# Patient Record
Sex: Male | Born: 1975 | Race: White | Hispanic: No | Marital: Married | Smoking: Former smoker
Health system: Southern US, Community
[De-identification: ages and names within clinical notes are randomized; demographics above are authoritative.]

## PROBLEM LIST (undated history)

## (undated) DIAGNOSIS — Z8052 Family history of malignant neoplasm of bladder: Secondary | ICD-10-CM

## (undated) DIAGNOSIS — M1990 Unspecified osteoarthritis, unspecified site: Secondary | ICD-10-CM

## (undated) DIAGNOSIS — T7840XA Allergy, unspecified, initial encounter: Secondary | ICD-10-CM

## (undated) DIAGNOSIS — K219 Gastro-esophageal reflux disease without esophagitis: Secondary | ICD-10-CM

## (undated) DIAGNOSIS — Z808 Family history of malignant neoplasm of other organs or systems: Secondary | ICD-10-CM

## (undated) DIAGNOSIS — G4730 Sleep apnea, unspecified: Secondary | ICD-10-CM

## (undated) DIAGNOSIS — G709 Myoneural disorder, unspecified: Secondary | ICD-10-CM

## (undated) DIAGNOSIS — R51 Headache: Secondary | ICD-10-CM

## (undated) HISTORY — DX: Allergy, unspecified, initial encounter: T78.40XA

## (undated) HISTORY — DX: Unspecified osteoarthritis, unspecified site: M19.90

## (undated) HISTORY — DX: Family history of malignant neoplasm of other organs or systems: Z80.8

## (undated) HISTORY — PX: ELBOW SURGERY: SHX618

## (undated) HISTORY — PX: CARPAL TUNNEL RELEASE: SHX101

## (undated) HISTORY — DX: Myoneural disorder, unspecified: G70.9

## (undated) HISTORY — PX: FRACTURE SURGERY: SHX138

## (undated) HISTORY — DX: Family history of malignant neoplasm of bladder: Z80.52

## (undated) HISTORY — PX: SPINE SURGERY: SHX786

---

## 2005-02-10 ENCOUNTER — Ambulatory Visit: Payer: Self-pay | Admitting: Family Medicine

## 2005-06-30 ENCOUNTER — Ambulatory Visit: Payer: Self-pay | Admitting: Family Medicine

## 2005-07-09 ENCOUNTER — Ambulatory Visit: Payer: Self-pay | Admitting: Family Medicine

## 2005-09-19 ENCOUNTER — Ambulatory Visit: Payer: Self-pay | Admitting: Family Medicine

## 2008-01-26 ENCOUNTER — Emergency Department (HOSPITAL_COMMUNITY): Admission: EM | Admit: 2008-01-26 | Discharge: 2008-01-26 | Payer: Self-pay | Admitting: Emergency Medicine

## 2009-08-11 HISTORY — PX: HERNIA REPAIR: SHX51

## 2010-06-19 ENCOUNTER — Ambulatory Visit (HOSPITAL_BASED_OUTPATIENT_CLINIC_OR_DEPARTMENT_OTHER): Admission: RE | Admit: 2010-06-19 | Discharge: 2010-06-19 | Payer: Self-pay | Admitting: Family Medicine

## 2010-06-19 ENCOUNTER — Ambulatory Visit: Payer: Self-pay | Admitting: Diagnostic Radiology

## 2010-07-19 ENCOUNTER — Encounter
Admission: RE | Admit: 2010-07-19 | Discharge: 2010-07-19 | Payer: Self-pay | Source: Home / Self Care | Attending: Surgery | Admitting: Surgery

## 2010-07-24 ENCOUNTER — Ambulatory Visit
Admission: RE | Admit: 2010-07-24 | Discharge: 2010-07-24 | Payer: Self-pay | Source: Home / Self Care | Attending: Surgery | Admitting: Surgery

## 2010-10-22 LAB — COMPREHENSIVE METABOLIC PANEL
Albumin: 3.9 g/dL (ref 3.5–5.2)
Alkaline Phosphatase: 57 U/L (ref 39–117)
BUN: 11 mg/dL (ref 6–23)
CO2: 28 mEq/L (ref 19–32)
Chloride: 104 mEq/L (ref 96–112)
GFR calc non Af Amer: >60 mL/min (ref >60)
Glucose, Bld: 99 mg/dL (ref 70–99)
Potassium: 4.1 mEq/L (ref 3.5–5.1)
Total Bilirubin: 0.6 mg/dL (ref 0.3–1.2)

## 2010-10-22 LAB — DIFFERENTIAL
Basophils Absolute: 0 10*3/uL (ref 0.0–0.1)
Basophils Relative: 0 % (ref 0–1)
Monocytes Absolute: 0.5 10*3/uL (ref 0.1–1.0)
Neutro Abs: 4.4 10*3/uL (ref 1.7–7.7)
Neutrophils Relative %: 67 % (ref 43–77)

## 2010-10-22 LAB — CBC
HCT: 41.5 % (ref 39.0–52.0)
Hemoglobin: 14.5 g/dL (ref 13.0–17.0)
MCV: 89.1 fL (ref 78.0–100.0)
RBC: 4.66 MIL/uL (ref 4.22–5.81)
WBC: 6.5 10*3/uL (ref 4.0–10.5)

## 2011-05-08 LAB — ROCKY MTN SPOTTED FVR AB, IGM-BLOOD: RMSF IgM: 0.35 IV

## 2011-05-08 LAB — DIFFERENTIAL
Basophils Absolute: 0
Basophils Relative: 1
Monocytes Relative: 9
Neutro Abs: 4.7
Neutrophils Relative %: 56

## 2011-05-08 LAB — CBC
MCHC: 35.1
RBC: 4.57

## 2011-07-14 ENCOUNTER — Other Ambulatory Visit (HOSPITAL_COMMUNITY): Payer: Self-pay | Admitting: Family Medicine

## 2011-07-14 DIAGNOSIS — R079 Chest pain, unspecified: Secondary | ICD-10-CM

## 2011-07-15 ENCOUNTER — Ambulatory Visit (HOSPITAL_COMMUNITY): Payer: 59 | Attending: Cardiology | Admitting: Radiology

## 2011-07-15 VITALS — Ht 74.0 in | Wt 226.0 lb

## 2011-07-15 DIAGNOSIS — Z87891 Personal history of nicotine dependence: Secondary | ICD-10-CM | POA: Insufficient documentation

## 2011-07-15 DIAGNOSIS — R0989 Other specified symptoms and signs involving the circulatory and respiratory systems: Secondary | ICD-10-CM | POA: Insufficient documentation

## 2011-07-15 DIAGNOSIS — R0602 Shortness of breath: Secondary | ICD-10-CM | POA: Insufficient documentation

## 2011-07-15 DIAGNOSIS — Z8249 Family history of ischemic heart disease and other diseases of the circulatory system: Secondary | ICD-10-CM | POA: Insufficient documentation

## 2011-07-15 DIAGNOSIS — R000 Tachycardia, unspecified: Secondary | ICD-10-CM | POA: Insufficient documentation

## 2011-07-15 DIAGNOSIS — R0609 Other forms of dyspnea: Secondary | ICD-10-CM | POA: Insufficient documentation

## 2011-07-15 DIAGNOSIS — I4510 Unspecified right bundle-branch block: Secondary | ICD-10-CM | POA: Insufficient documentation

## 2011-07-15 DIAGNOSIS — R0789 Other chest pain: Secondary | ICD-10-CM | POA: Insufficient documentation

## 2011-07-15 DIAGNOSIS — I10 Essential (primary) hypertension: Secondary | ICD-10-CM | POA: Insufficient documentation

## 2011-07-15 DIAGNOSIS — R61 Generalized hyperhidrosis: Secondary | ICD-10-CM | POA: Insufficient documentation

## 2011-07-15 DIAGNOSIS — R079 Chest pain, unspecified: Secondary | ICD-10-CM

## 2011-07-15 MED ORDER — TECHNETIUM TC 99M TETROFOSMIN IV KIT
11.0000 | PACK | Freq: Once | INTRAVENOUS | Status: AC | PRN
  Administered 2011-07-15: 11 via INTRAVENOUS

## 2011-07-15 MED ORDER — REGADENOSON 0.4 MG/5ML IV SOLN
0.4000 mg | Freq: Once | INTRAVENOUS | Status: AC
  Administered 2011-07-15: 0.4 mg via INTRAVENOUS

## 2011-07-15 MED ORDER — TECHNETIUM TC 99M TETROFOSMIN IV KIT
33.0000 | PACK | Freq: Once | INTRAVENOUS | Status: AC | PRN
  Administered 2011-07-15: 33 via INTRAVENOUS

## 2011-07-15 NOTE — Progress Notes (Signed)
[facility] SITE 3 NUCLEAR MED [address] [place] [address] [phone]  Cardiology Nuclear Med Study  Douglas [name] is a 35 y.o. male [id number] 09-24-1975   Nuclear Med Background Indication for Stress Test:  Evaluation for Ischemia History:  No previous documented CAD Cardiac Risk Factors: Family History - CAD, History of Smoking, IRBBB and Hypertension  Symptoms:  Chest Pain/Pressure with and without Exertion (last episode of chest discomfort was this morning in the lobby, none now), Diaphoresis, DOE, Rapid HR and SOB   Nuclear Pre-Procedure Caffeine/Decaff Intake:  None NPO After: 9:00pm   Lungs:  Slight expiratory wheeze.  O2 SAT 99% on RA.   Albuterol inhaler used prior to exercise. IV 0.9% NS with Angio Cath:  20g  IV Site: R Wrist  IV Started by:  Douglas [name], EMT-P  Chest Size (in):  52 Cup Size: n/a  Height: 6\' 2"  (1.88 m)  Weight:  226 lb (102.513 kg)  BMI:  Body mass index is 29.02 kg/(m^2). Tech Comments:  Propranolol held > 24 hours, per patient.    Nuclear Med Study 1 or 2 day study: 1 day  Stress Test Type:  Treadmill/Lexiscan  Reading MD: Douglas Ancona, MD  Order Authorizing Provider:  Larita Fife, MD Douglas [name])  Resting Radionuclide: Technetium 26m Tetrofosmin  Resting Radionuclide Dose: 11.0 mCi   Stress Radionuclide:  Technetium 58m Tetrofosmin  Stress Radionuclide Dose: 33.0 mCi           Stress Protocol Rest HR: 51 Stress HR: 133  Rest BP: Sitting:116/72  Standing:110/72 Stress BP: 170/58  Exercise Time (min): 13:30 METS: 13.4   Predicted Max HR: 183 bpm % Max HR: 72.68 bpm Rate Pressure Product: 96295   Dose of Adenosine (mg):  n/a Dose of Lexiscan: 0.4 mg  Dose of Atropine (mg): n/a Dose of Dobutamine: n/a mcg/kg/min (at max HR)  Stress Test Technologist: Douglas [name], CMA-N  Nuclear Technologist:  Douglas [name], CNMT     Rest Procedure:  Myocardial perfusion imaging was performed at rest 45  minutes following the intravenous administration of Technetium 7m Tetrofosmin.  Rest ECG: RBBB  Stress Procedure: The patient initially walked the treadmill for 12:00 utilizing the Bruce protocol, but was unable to achieve his target heart rate.   He was then given IV Lexiscan 0.4 mg over 15-seconds with concurrent low level exercise and then Technetium 29m Tetrofosmin was injected at 30-seconds while the patient continued walking one more minute.  There were no diagnostic ST-T wave changes with Lexiscan.  He did c/o chest tightness, 4/10, with lexiscan.  Quantitative spect images were obtained after a 45-minute delay.  Stress ECG: Insignificant upsloping ST segment depression.  QPS Raw Data Images:  Normal; no motion artifact; normal heart/lung ratio. Stress Images:  Normal homogeneous uptake in all areas of the myocardium. Rest Images:  Normal homogeneous uptake in all areas of the myocardium. Subtraction (SDS):  There is no evidence of scar or ischemia. Transient Ischemic Dilatation (Normal <1.22):  0.96 Lung/Heart Ratio (Normal <0.45):  0.36  Quantitative Gated Spect Images QGS EDV:  125 ml QGS ESV:  40 ml QGS cine images:  NL LV Function; NL Wall Motion QGS EF: 68%  Impression Exercise Capacity:  Lexiscan with low level exercise.  Good exercise tolerance, went 13:30 minutes.  BP Response:  Normal blood pressure response. Clinical Symptoms:  Mild chest pain.  ECG Impression:  Insignificant upsloping ST segment depression. Comparison with Prior Nuclear Study: No previous nuclear  study performed  Overall Impression:  Normal stress nuclear study.  Douglas [name] Douglas [name]

## 2011-07-16 NOTE — Progress Notes (Signed)
Copy of Nuc report Faxed to Dr. Antony Haste @ [phone].Scarlette Ar

## 2013-02-07 ENCOUNTER — Encounter (INDEPENDENT_AMBULATORY_CARE_PROVIDER_SITE_OTHER): Payer: Self-pay | Admitting: Surgery

## 2013-02-07 ENCOUNTER — Ambulatory Visit (INDEPENDENT_AMBULATORY_CARE_PROVIDER_SITE_OTHER): Payer: 59 | Admitting: Surgery

## 2013-02-07 VITALS — BP 118/70 | HR 74 | Resp 14 | Ht 74.0 in | Wt 242.2 lb

## 2013-02-07 DIAGNOSIS — R1032 Left lower quadrant pain: Secondary | ICD-10-CM

## 2013-02-07 MED ORDER — GABAPENTIN 300 MG PO CAPS: 300.0000 mg | ORAL_CAPSULE | Freq: Three times a day (TID) | ORAL | Status: DC

## 2013-02-07 NOTE — Patient Instructions (Signed)
Take anti inflammatory medication as before.  Neurontin is taken every 8 hours for pain.  Ice pack after work.  CT scan ordered to evaluate left groin pain.  Return to office to review tests and decide on surgery.

## 2013-02-07 NOTE — Progress Notes (Signed)
Subjective:     Patient ID: Douglas [name], male   DOB: [birth year], 37 y.o.   MRN: [id number]  HPI Patient presents with chief complaint of left groin pain for 1 year. He underwent repair of left inguinal hernia in 2011 and did well for 2 years. He was picking up a 40 pound box of caval one year ago and had a sudden onset of severe left groin pain. The pain comes and goes and is shooting in nature. Is severe at times. Is made worse with lifting and a sitting. The pain is intermittent. He feels a pulling sensation in his left groin with exertion. There is radiation of pain towards his left hip bone. He was pain-free prior to this since his surgery date in 2011.  Review of Systems  Respiratory: Negative.   Gastrointestinal: Positive for abdominal pain.  Musculoskeletal: Negative.        Objective:   Physical Exam  Constitutional: He appears well-developed and well-nourished.  HENT:  Head: Normocephalic and atraumatic.  Neck: Normal range of motion. Neck supple.  Abdominal:         Assessment:     Left groin pain 3 years after repair of left inguinal hernia with mesh after lifting one year ago. Pain is worsening over the last year.    Plan:     We'll check CT scan for hernia recurrence since one is not fell on exam. May require laparoscopy at a later time depending on symptoms and CT result. Start Neurontin 3 mg by mouth 3 times a day and recommend he continue his anti-inflammatory medication from his primary care doctor. Recommend ice packs and to keep lifting to a minimum and to evaluate completed. Return to clinic 3-4 weeks.

## 2013-02-28 ENCOUNTER — Ambulatory Visit
Admission: RE | Admit: 2013-02-28 | Discharge: 2013-02-28 | Disposition: A | Discharge status: Home / Self Care | Payer: 59 | Source: Ambulatory Visit | Attending: Surgery | Admitting: Surgery

## 2013-02-28 ENCOUNTER — Other Ambulatory Visit: Payer: 59

## 2013-02-28 ENCOUNTER — Other Ambulatory Visit (INDEPENDENT_AMBULATORY_CARE_PROVIDER_SITE_OTHER): Payer: Self-pay | Admitting: Surgery

## 2013-02-28 ENCOUNTER — Ambulatory Visit: Admission: RE | Admit: 2013-02-28 | Payer: 59 | Source: Ambulatory Visit

## 2013-02-28 DIAGNOSIS — R1032 Left lower quadrant pain: Secondary | ICD-10-CM

## 2013-02-28 MED ORDER — IOHEXOL 300 MG/ML  SOLN
125.0000 mL | Freq: Once | INTRAMUSCULAR | Status: AC | PRN
  Administered 2013-02-28: 125 mL via INTRAVENOUS

## 2013-03-07 ENCOUNTER — Encounter (INDEPENDENT_AMBULATORY_CARE_PROVIDER_SITE_OTHER): Payer: Self-pay | Admitting: Surgery

## 2013-03-07 ENCOUNTER — Ambulatory Visit (INDEPENDENT_AMBULATORY_CARE_PROVIDER_SITE_OTHER): Payer: 59 | Admitting: Surgery

## 2013-03-07 VITALS — BP 120/76 | HR 68 | Temp 97.8°F | Resp 14 | Ht 74.0 in | Wt 251.6 lb

## 2013-03-07 DIAGNOSIS — R1032 Left lower quadrant pain: Secondary | ICD-10-CM

## 2013-03-07 NOTE — Progress Notes (Signed)
Subjective:     Patient ID: Douglas [name], male   DOB: 1976/01/23, 37 y.o.   MRN: [id number]  HPI Patient presents with chief complaint of left groin pain for 1 year. He underwent repair of left inguinal hernia in 2011 and did well for 2 years. He was picking up a 40 pound box of caval one year ago and had a sudden onset of severe left groin pain. The pain comes and goes and is shooting in nature. Is severe at times. Is made worse with lifting and a sitting. The pain is intermittent. He feels a pulling sensation in his left groin with exertion. There is radiation of pain towards his left hip bone. He was pain-free prior to this since his surgery date in 2011.he returns after CT scanning to discuss results. He still has pain.  Review of Systems  Respiratory: Negative.   Gastrointestinal: Positive for abdominal pain.  Musculoskeletal: Negative.        Objective:   Physical Exam  Constitutional: He appears well-developed and well-nourished.  HENT:  Head: Normocephalic and atraumatic.  Neck: Normal range of motion. Neck supple.  Abdominal:    Findings: There is slight scarring in the left inguinal region from  previous hernia repair. There is no recurrent hernia. There is no  focal soft tissue mass at the site of the patient's soft tissue  bulge.  There is no adenopathy.  The liver, spleen, pancreas, adrenal glands, and, biliary tree, and  kidneys are normal. The bowel is normal including the terminal  ileum and appendix. Prostate gland is not enlarged. No  significant osseous abnormality.  IMPRESSION:  Benign-appearing abdomen and pelvis. Scarring in the left inguinal  region from previous hernia repair. No recurrent hernia or soft  tissue mass.      Assessment:     Left groin pain 3 years after repair of left inguinal hernia with mesh after lifting one year ago. Pain is worsening over the last year. Concern for recurrent left inguinal hernia and possible right inguinal  hernia.    Plan:     Recommend laparoscopic possible bilateral inguinal hernia repair with mesh. Discussed the pros and cons of this procedure. His pain is not improving with conservative measures. Discussed risk of bleeding, infection, injury to blood vessel, organ injury, need for open procedure, recurrence, chronic pain, use of mesh, and the need for other operative intervention. He would  like to proceed. The risk of hernia repair include bleeding,  Infection,   Recurrence of the hernia,  Mesh use, chronic pain,  Organ injury,  Bowel injury,  Bladder injury,   nerve injury with numbness around the incision,  Death,  and worsening of preexisting  medical problems.  The alternatives to surgery have been discussed as well..  Long term expectations of both operative and non operative treatments have been discussed.   The patient agrees to proceed.

## 2013-03-07 NOTE — Patient Instructions (Signed)
Hernia Repair with Laparoscope A hernia occurs when an internal organ pushes out through a weak spot in the belly (abdominal) wall muscles. Hernias most commonly occur in the groin and around the navel. Hernias can also occur through a cut by the surgeon (incision) after an abdominal operation. A hernia may be caused by:  Lifting heavy objects.  Prolonged coughing.  Straining to move your bowels. Hernias can often be pushed back into place (reduced). Most hernias tend to get worse over time. Problems occur when abdominal contents get stuck in the opening and the blood supply is blocked or impaired (incarcerated hernia). Because of these risks, you require surgery to repair the hernia. Your hernia will be repaired using a laparoscope. Laparoscopic surgery is a type of minimally invasive surgery. It does not involve making a typical surgical cut (incision) in the skin. A laparoscope is a telescope-like rod and lens system. It is usually connected to a video camera and a light source so your caregiver can clearly see the operative area. The instruments are inserted through  to  inch (5 mm or 10 mm) openings in the skin at specific locations. A working and viewing space is created by blowing a small amount of carbon dioxide gas into the abdominal cavity. The abdomen is essentially blown up like a balloon (insufflated). This elevates the abdominal wall above the internal organs like a dome. The carbon dioxide gas is common to the human body and can be absorbed by tissue and removed by the respiratory system. Once the repair is completed, the small incisions will be closed with either stitches (sutures) or staples (just like a paper stapler only this staple holds the skin together). LET YOUR CAREGIVERS KNOW ABOUT:  Allergies.  Medications taken including herbs, eye drops, over the counter medications, and creams.  Use of steroids (by mouth or creams).  Previous problems with anesthetics or  Novocaine.  Possibility of pregnancy, if this applies.  History of blood clots (thrombophlebitis).  History of bleeding or blood problems.  Previous surgery.  Other health problems. BEFORE THE PROCEDURE  Laparoscopy can be done either in a hospital or out-patient clinic. You may be given a mild sedative to help you relax before the procedure. Once in the operating room, you will be given a general anesthesia to make you sleep (unless you and your caregiver choose a different anesthetic).  AFTER THE PROCEDURE  After the procedure you will be watched in a recovery area. Depending on what type of hernia was repaired, you might be admitted to the hospital or you might go home the same day. With this procedure you may have less pain and scarring. This usually results in a quicker recovery and less risk of infection. HOME CARE INSTRUCTIONS   Bed rest is not required. You may continue your normal activities but avoid heavy lifting (more than 10 pounds) or straining.  Cough gently. If you are a smoker it is best to stop, as even the best hernia repair can break down with the continual strain of coughing.  Avoid driving until given the OK by your surgeon.  There are no dietary restrictions unless given otherwise.  TAKE ALL MEDICATIONS AS DIRECTED.  Only take over-the-counter or prescription medicines for pain, discomfort, or fever as directed by your caregiver. SEEK MEDICAL CARE IF:   There is increasing abdominal pain or pain in your incisions.  There is more bleeding from incisions, other than minimal spotting.  You feel light headed or faint.  You   develop an unexplained fever, chills, and/or an oral temperature above 102 F (38.9 C).  You have redness, swelling, or increasing pain in the wound.  Pus coming from wound.  A foul smell coming from the wound or dressings. SEEK IMMEDIATE MEDICAL CARE IF:   You develop a rash.  You have difficulty breathing.  You have any  allergic problems. MAKE SURE YOU:   Understand these instructions.  Will watch your condition.  Will get help right away if you are not doing well or get worse. Document Released: 07/28/2005 Document Revised: 10/20/2011 Document Reviewed: 06/27/2009 Exit[employer] Patient Information 2014 [employer].  

## 2013-03-17 ENCOUNTER — Encounter (HOSPITAL_COMMUNITY): Payer: Self-pay | Admitting: Pharmacy Technician

## 2013-03-23 ENCOUNTER — Other Ambulatory Visit (HOSPITAL_COMMUNITY): Payer: Self-pay | Admitting: Surgery

## 2013-03-23 NOTE — Patient Instructions (Addendum)
20 Douglas [name]  03/23/2013   Your procedure is scheduled on: 03-29-2013  Report to [facility] at 730  AM.  Call this number if you have problems the morning of surgery [phone]   Remember:bring cpap mask and tubing   Do not eat food or drink liquids :After Midnight.     Take these medicines the morning of surgery with A SIP OF WATER: propranolol, gabapentin, hydrocodone if needed                                SEE [facility] PREPARING FOR SURGERY SHEET   Do not wear jewelry, make-up or nail polish.  Do not wear lotions, powders, or perfumes. You may wear deodorant.   Men may shave face and neck.  Do not bring valuables to the hospital. [facility] IS NOT RESPONSIBLE FOR VALUEABLES.  Contacts, dentures or bridgework may not be worn into surgery.  Leave suitcase in the car. After surgery it may be brought to your room.  For patients admitted to the hospital, checkout time is 11:00 AM the day of discharge.   Patients discharged the day of surgery will not be allowed to drive home.  Name and phone number of your driver: sue wife cell [phone]  Special Instructions: N/A   Please read over the following fact sheets that you were given: MRSA Information.  Call Cain Sieve RN pre op nurse if needed [phone]    FAILURE TO FOLLOW THESE INSTRUCTIONS MAY RESULT IN THE CANCELLATION OF YOUR SURGERY.  PATIENT SIGNATURE___________________________________________  NURSE SIGNATURE_____________________________________________

## 2013-03-23 NOTE — Progress Notes (Signed)
lov note 10-14-2012 dr Marlena Clipper neurology on chart

## 2013-03-24 ENCOUNTER — Encounter (HOSPITAL_COMMUNITY)
Admission: RE | Admit: 2013-03-24 | Discharge: 2013-03-24 | Disposition: A | Discharge status: Home / Self Care | Payer: 59 | Source: Ambulatory Visit | Attending: Surgery | Admitting: Surgery

## 2013-03-24 ENCOUNTER — Encounter (HOSPITAL_COMMUNITY): Payer: Self-pay

## 2013-03-24 DIAGNOSIS — K4020 Bilateral inguinal hernia, without obstruction or gangrene, not specified as recurrent: Secondary | ICD-10-CM | POA: Insufficient documentation

## 2013-03-24 DIAGNOSIS — Z01810 Encounter for preprocedural cardiovascular examination: Secondary | ICD-10-CM | POA: Insufficient documentation

## 2013-03-24 DIAGNOSIS — Z01812 Encounter for preprocedural laboratory examination: Secondary | ICD-10-CM | POA: Insufficient documentation

## 2013-03-24 HISTORY — DX: Sleep apnea, unspecified: G47.30

## 2013-03-24 HISTORY — DX: Gastro-esophageal reflux disease without esophagitis: K21.9

## 2013-03-24 HISTORY — DX: Headache: R51

## 2013-03-24 LAB — CBC
MCH: 30.7 pg (ref 26.0–34.0)
MCV: 88.4 fL (ref 78.0–100.0)
Platelets: 198 10*3/uL (ref 150–400)
RBC: 4.5 MIL/uL (ref 4.22–5.81)

## 2013-03-24 LAB — BASIC METABOLIC PANEL
CO2: 29 mEq/L (ref 19–32)
Calcium: 9.4 mg/dL (ref 8.4–10.5)
Creatinine, Ser: 0.88 mg/dL (ref 0.50–1.35)
Glucose, Bld: 99 mg/dL (ref 70–99)
Sodium: 140 mEq/L (ref 135–145)

## 2013-03-28 NOTE — Progress Notes (Signed)
Pt notified of time change to 9:00 am - instructed to arrive at 6:30 am to Short stay center

## 2013-03-29 ENCOUNTER — Encounter (HOSPITAL_COMMUNITY)
Admission: RE | Disposition: A | Discharge status: Home / Self Care | Payer: Self-pay | Source: Ambulatory Visit | Attending: Surgery

## 2013-03-29 ENCOUNTER — Encounter (HOSPITAL_COMMUNITY): Payer: Self-pay | Admitting: Anesthesiology

## 2013-03-29 ENCOUNTER — Encounter (HOSPITAL_COMMUNITY): Payer: Self-pay

## 2013-03-29 ENCOUNTER — Observation Stay (HOSPITAL_COMMUNITY)
Admission: RE | Admit: 2013-03-29 | Discharge: 2013-03-30 | Disposition: A | Discharge status: Home / Self Care | Payer: 59 | Source: Ambulatory Visit | Attending: Surgery | Admitting: Surgery

## 2013-03-29 ENCOUNTER — Ambulatory Visit (HOSPITAL_COMMUNITY): Payer: 59 | Admitting: Anesthesiology

## 2013-03-29 DIAGNOSIS — Z5331 Laparoscopic surgical procedure converted to open procedure: Secondary | ICD-10-CM | POA: Insufficient documentation

## 2013-03-29 DIAGNOSIS — K4021 Bilateral inguinal hernia, without obstruction or gangrene, recurrent: Principal | ICD-10-CM | POA: Insufficient documentation

## 2013-03-29 DIAGNOSIS — K4091 Unilateral inguinal hernia, without obstruction or gangrene, recurrent: Secondary | ICD-10-CM

## 2013-03-29 DIAGNOSIS — K4090 Unilateral inguinal hernia, without obstruction or gangrene, not specified as recurrent: Secondary | ICD-10-CM | POA: Insufficient documentation

## 2013-03-29 DIAGNOSIS — Z9889 Other specified postprocedural states: Secondary | ICD-10-CM

## 2013-03-29 HISTORY — PX: INSERTION OF MESH: SHX5868

## 2013-03-29 HISTORY — PX: INGUINAL HERNIA REPAIR: SHX194

## 2013-03-29 LAB — CBC
Platelets: 179 10*3/uL (ref 150–400)
RDW: 11.9 % (ref 11.5–15.5)
WBC: 11.2 10*3/uL — ABNORMAL HIGH (ref 4.0–10.5)

## 2013-03-29 LAB — CREATININE, SERUM
GFR calc Af Amer: >90 mL/min (ref >90)
GFR calc non Af Amer: >90 mL/min (ref >90)

## 2013-03-29 SURGERY — REPAIR, HERNIA, INGUINAL, BILATERAL, LAPAROSCOPIC
Anesthesia: General | Site: Abdomen | Wound class: Clean

## 2013-03-29 MED ORDER — 0.9 % SODIUM CHLORIDE (POUR BTL) OPTIME
TOPICAL | Status: DC | PRN
  Administered 2013-03-29: 1000 mL

## 2013-03-29 MED ORDER — LACTATED RINGERS IV SOLN
INTRAVENOUS | Status: DC
Start: 2013-03-29 — End: 2013-03-29
  Administered 2013-03-29: 1000 mL via INTRAVENOUS
  Administered 2013-03-29 (×2): via INTRAVENOUS

## 2013-03-29 MED ORDER — CEFAZOLIN SODIUM-DEXTROSE 2-3 GM-% IV SOLR
INTRAVENOUS | Status: AC
  Filled 2013-03-29: qty 50

## 2013-03-29 MED ORDER — CEFAZOLIN SODIUM-DEXTROSE 2-3 GM-% IV SOLR
2.0000 g | INTRAVENOUS | Status: AC
  Administered 2013-03-29: 2 g via INTRAVENOUS

## 2013-03-29 MED ORDER — KCL IN DEXTROSE-NACL 20-5-0.9 MEQ/L-%-% IV SOLN
INTRAVENOUS | Status: DC
  Administered 2013-03-29 – 2013-03-30 (×2): via INTRAVENOUS
  Filled 2013-03-29 (×4): qty 1000

## 2013-03-29 MED ORDER — ONDANSETRON HCL 4 MG/2ML IJ SOLN: 4.0000 mg | Freq: Four times a day (QID) | INTRAMUSCULAR | Status: DC | PRN

## 2013-03-29 MED ORDER — BUPIVACAINE LIPOSOME 1.3 % IJ SUSP
INTRAMUSCULAR | Status: DC | PRN
  Administered 2013-03-29: 20 mL

## 2013-03-29 MED ORDER — ONDANSETRON HCL 4 MG PO TABS: 4.0000 mg | ORAL_TABLET | Freq: Four times a day (QID) | ORAL | Status: DC | PRN

## 2013-03-29 MED ORDER — OXYCODONE-ACETAMINOPHEN 5-325 MG PO TABS
1.0000 | ORAL_TABLET | ORAL | Status: DC | PRN
  Administered 2013-03-30: 2 via ORAL
  Administered 2013-03-30: 1 via ORAL
  Filled 2013-03-29: qty 2
  Filled 2013-03-29: qty 1

## 2013-03-29 MED ORDER — ROCURONIUM BROMIDE 100 MG/10ML IV SOLN
INTRAVENOUS | Status: DC | PRN
  Administered 2013-03-29: 30 mg via INTRAVENOUS
  Administered 2013-03-29: 10 mg via INTRAVENOUS
  Administered 2013-03-29: 20 mg via INTRAVENOUS
  Administered 2013-03-29: 10 mg via INTRAVENOUS

## 2013-03-29 MED ORDER — SUCCINYLCHOLINE CHLORIDE 20 MG/ML IJ SOLN
INTRAMUSCULAR | Status: DC | PRN
  Administered 2013-03-29: 100 mg via INTRAVENOUS

## 2013-03-29 MED ORDER — ENOXAPARIN SODIUM 40 MG/0.4ML ~~LOC~~ SOLN
40.0000 mg | SUBCUTANEOUS | Status: DC
  Administered 2013-03-30: 40 mg via SUBCUTANEOUS
  Filled 2013-03-29 (×2): qty 0.4

## 2013-03-29 MED ORDER — LACTATED RINGERS IR SOLN
Status: DC | PRN
  Administered 2013-03-29: 1000 mL

## 2013-03-29 MED ORDER — HYDROMORPHONE HCL PF 1 MG/ML IJ SOLN
INTRAMUSCULAR | Status: AC
  Filled 2013-03-29: qty 1

## 2013-03-29 MED ORDER — KETOROLAC TROMETHAMINE 30 MG/ML IJ SOLN
15.0000 mg | Freq: Once | INTRAMUSCULAR | Status: AC | PRN
  Administered 2013-03-29: 30 mg via INTRAVENOUS

## 2013-03-29 MED ORDER — PROPRANOLOL HCL ER 120 MG PO CP24
120.0000 mg | ORAL_CAPSULE | Freq: Every morning | ORAL | Status: DC
  Administered 2013-03-30: 120 mg via ORAL
  Filled 2013-03-29: qty 1

## 2013-03-29 MED ORDER — ACETAMINOPHEN 10 MG/ML IV SOLN
1000.0000 mg | Freq: Once | INTRAVENOUS | Status: AC
  Administered 2013-03-29: 1000 mg via INTRAVENOUS
  Filled 2013-03-29: qty 100

## 2013-03-29 MED ORDER — HYDROMORPHONE HCL PF 1 MG/ML IJ SOLN
1.0000 mg | INTRAMUSCULAR | Status: DC | PRN
  Administered 2013-03-29 – 2013-03-30 (×4): 1 mg via INTRAVENOUS
  Filled 2013-03-29 (×4): qty 1

## 2013-03-29 MED ORDER — BUPIVACAINE-EPINEPHRINE (PF) 0.25% -1:200000 IJ SOLN
INTRAMUSCULAR | Status: DC | PRN
  Administered 2013-03-29: 5 mL

## 2013-03-29 MED ORDER — FENTANYL CITRATE 0.05 MG/ML IJ SOLN
INTRAMUSCULAR | Status: DC | PRN
  Administered 2013-03-29 (×2): 50 ug via INTRAVENOUS
  Administered 2013-03-29: 100 ug via INTRAVENOUS
  Administered 2013-03-29: 50 ug via INTRAVENOUS

## 2013-03-29 MED ORDER — BUPIVACAINE LIPOSOME 1.3 % IJ SUSP
20.0000 mL | Freq: Once | INTRAMUSCULAR | Status: DC
  Filled 2013-03-29: qty 20

## 2013-03-29 MED ORDER — PROPOFOL 10 MG/ML IV BOLUS
INTRAVENOUS | Status: DC | PRN
  Administered 2013-03-29: 180 mg via INTRAVENOUS

## 2013-03-29 MED ORDER — LIDOCAINE HCL (CARDIAC) 20 MG/ML IV SOLN
INTRAVENOUS | Status: DC | PRN
  Administered 2013-03-29: 50 mg via INTRAVENOUS

## 2013-03-29 MED ORDER — BUPIVACAINE-EPINEPHRINE PF 0.25-1:200000 % IJ SOLN
INTRAMUSCULAR | Status: AC
  Filled 2013-03-29: qty 30

## 2013-03-29 MED ORDER — GABAPENTIN 300 MG PO CAPS
300.0000 mg | ORAL_CAPSULE | Freq: Three times a day (TID) | ORAL | Status: DC
  Administered 2013-03-29 – 2013-03-30 (×3): 300 mg via ORAL
  Filled 2013-03-29 (×5): qty 1

## 2013-03-29 MED ORDER — MIDAZOLAM HCL 5 MG/5ML IJ SOLN
INTRAMUSCULAR | Status: DC | PRN
  Administered 2013-03-29: 2 mg via INTRAVENOUS

## 2013-03-29 MED ORDER — ONDANSETRON HCL 4 MG/2ML IJ SOLN
INTRAMUSCULAR | Status: DC | PRN
  Administered 2013-03-29: 4 mg via INTRAVENOUS

## 2013-03-29 MED ORDER — EPHEDRINE SULFATE 50 MG/ML IJ SOLN
INTRAMUSCULAR | Status: DC | PRN
  Administered 2013-03-29: 10 mg via INTRAVENOUS

## 2013-03-29 MED ORDER — KETOROLAC TROMETHAMINE 30 MG/ML IJ SOLN
30.0000 mg | Freq: Four times a day (QID) | INTRAMUSCULAR | Status: DC | PRN
  Administered 2013-03-30: 30 mg via INTRAVENOUS
  Filled 2013-03-29: qty 1

## 2013-03-29 MED ORDER — PROMETHAZINE HCL 25 MG/ML IJ SOLN: 6.2500 mg | INTRAMUSCULAR | Status: DC | PRN

## 2013-03-29 MED ORDER — ATROPINE SULFATE 0.4 MG/ML IJ SOLN
INTRAMUSCULAR | Status: DC | PRN
  Administered 2013-03-29 (×2): 0.4 mg via INTRAVENOUS

## 2013-03-29 MED ORDER — GLYCOPYRROLATE 0.2 MG/ML IJ SOLN
INTRAMUSCULAR | Status: DC | PRN
  Administered 2013-03-29: 0.6 mg via INTRAVENOUS

## 2013-03-29 MED ORDER — KETOROLAC TROMETHAMINE 30 MG/ML IJ SOLN
INTRAMUSCULAR | Status: AC
  Filled 2013-03-29: qty 1

## 2013-03-29 MED ORDER — PRIMIDONE 50 MG PO TABS
50.0000 mg | ORAL_TABLET | Freq: Every day | ORAL | Status: DC
  Filled 2013-03-29 (×2): qty 1

## 2013-03-29 MED ORDER — NEOSTIGMINE METHYLSULFATE 1 MG/ML IJ SOLN
INTRAMUSCULAR | Status: DC | PRN
  Administered 2013-03-29: 4 mg via INTRAVENOUS

## 2013-03-29 MED ORDER — HYDROMORPHONE HCL PF 1 MG/ML IJ SOLN
0.2500 mg | INTRAMUSCULAR | Status: DC | PRN
  Administered 2013-03-29 (×2): 0.25 mg via INTRAVENOUS
  Administered 2013-03-29: 0.5 mg via INTRAVENOUS

## 2013-03-29 SURGICAL SUPPLY — 51 items
APPLIER CLIP 5 13 M/L LIGAMAX5 (MISCELLANEOUS) ×2
BLADE SURG SZ10 CARB STEEL (BLADE) ×2 IMPLANT
CLIP APPLIE 5 13 M/L LIGAMAX5 (MISCELLANEOUS) ×1 IMPLANT
CLOTH BEACON ORANGE TIMEOUT ST (SAFETY) ×2 IMPLANT
COVER SURGICAL LIGHT HANDLE (MISCELLANEOUS) ×2 IMPLANT
DECANTER SPIKE VIAL GLASS SM (MISCELLANEOUS) IMPLANT
DERMABOND ADVANCED (GAUZE/BANDAGES/DRESSINGS)
DERMABOND ADVANCED .7 DNX12 (GAUZE/BANDAGES/DRESSINGS) IMPLANT
DEVICE SECURE STRAP 25 ABSORB (INSTRUMENTS) ×2 IMPLANT
DISSECT BALLN SPACEMKR + OVL (BALLOONS)
DISSECTOR BALLN SPACEMKR + OVL (BALLOONS) IMPLANT
DISSECTOR BLUNT TIP ENDO 5MM (MISCELLANEOUS) IMPLANT
DRAPE LAPAROSCOPIC ABDOMINAL (DRAPES) ×2 IMPLANT
DRAPE UTILITY XL STRL (DRAPES) ×2 IMPLANT
DRAPE WARM FLUID 44X44 (DRAPE) ×2 IMPLANT
ELECT REM PT RETURN 9FT ADLT (ELECTROSURGICAL) ×2
ELECTRODE REM PT RTRN 9FT ADLT (ELECTROSURGICAL) ×1 IMPLANT
GLOVE BIOGEL PI IND STRL 7.0 (GLOVE) ×1 IMPLANT
GLOVE BIOGEL PI IND STRL 7.5 (GLOVE) ×2 IMPLANT
GLOVE BIOGEL PI INDICATOR 7.0 (GLOVE) ×1
GLOVE BIOGEL PI INDICATOR 7.5 (GLOVE) ×2
GLOVE INDICATOR 8.0 STRL GRN (GLOVE) ×6 IMPLANT
GLOVE SS BIOGEL STRL SZ 8 (GLOVE) ×2 IMPLANT
GLOVE SUPERSENSE BIOGEL SZ 8 (GLOVE) ×2
GLOVE SURG SS PI 6.5 STRL IVOR (GLOVE) ×2 IMPLANT
GLOVE SURG SS PI 7.0 STRL IVOR (GLOVE) ×2 IMPLANT
GOWN STRL NON-REIN LRG LVL3 (GOWN DISPOSABLE) ×6 IMPLANT
GOWN STRL REIN XL XLG (GOWN DISPOSABLE) ×2 IMPLANT
KIT BASIN OR (CUSTOM PROCEDURE TRAY) ×2 IMPLANT
MARKER SKIN DUAL TIP RULER LAB (MISCELLANEOUS) IMPLANT
MESH ULTRAPRO 6X6 15CM15CM (Mesh General) ×4 IMPLANT
PENCIL BUTTON HOLSTER BLD 10FT (ELECTRODE) ×2 IMPLANT
SCISSORS LAP 5X35 DISP (ENDOMECHANICALS) ×4 IMPLANT
SET IRRIG TUBING LAPAROSCOPIC (IRRIGATION / IRRIGATOR) ×2 IMPLANT
SOLUTION ANTI FOG 6CC (MISCELLANEOUS) ×2 IMPLANT
SPONGE GAUZE 4X4 12PLY (GAUZE/BANDAGES/DRESSINGS) ×2 IMPLANT
SPONGE LAP 18X18 X RAY DECT (DISPOSABLE) ×4 IMPLANT
STAPLER VISISTAT 35W (STAPLE) ×2 IMPLANT
SUT MNCRL AB 4-0 PS2 18 (SUTURE) ×2 IMPLANT
SUT PDS AB 1 TP1 96 (SUTURE) ×4 IMPLANT
SUT VIC AB 2-0 SH 18 (SUTURE) ×6 IMPLANT
SUT VIC AB 3-0 SH 18 (SUTURE) ×2 IMPLANT
SUT VICRYL 0 UR6 27IN ABS (SUTURE) ×2 IMPLANT
TACKER 5MM HERNIA 3.5CML NAB (ENDOMECHANICALS) IMPLANT
TAPE CLOTH SURG 4X10 WHT LF (GAUZE/BANDAGES/DRESSINGS) ×2 IMPLANT
TOWEL OR 17X26 10 PK STRL BLUE (TOWEL DISPOSABLE) ×2 IMPLANT
TRAY FOLEY CATH 14FRSI W/METER (CATHETERS) ×2 IMPLANT
TRAY LAP CHOLE (CUSTOM PROCEDURE TRAY) ×2 IMPLANT
TROCAR XCEL BLADELESS 5X75MML (TROCAR) ×4 IMPLANT
TUBING INSUFFLATION 10FT LAP (TUBING) ×2 IMPLANT
YANKAUER SUCT BULB TIP 10FT TU (MISCELLANEOUS) ×2 IMPLANT

## 2013-03-29 NOTE — Anesthesia Postprocedure Evaluation (Signed)
  Anesthesia Post-op Note  Patient: Douglas [name]  Procedure(s) Performed: Procedure(s) (LRB): LAPAROSCOPIC BILATERAL INGUINAL HERNIA REPAIR (N/A) INSERTION OF MESH (N/A)  Patient Location: PACU  Anesthesia Type: General  Level of Consciousness: awake and alert   Airway and Oxygen Therapy: Patient Spontanous Breathing  Post-op Pain: mild  Post-op Assessment: Post-op Vital signs reviewed, Patient's Cardiovascular Status Stable, Respiratory Function Stable, Patent Airway and No signs of Nausea or vomiting  Last Vitals:  Filed Vitals:   03/29/13 1330  BP: 109/63  Pulse: 55  Temp:   Resp: 19    Post-op Vital Signs: stable   Complications: No apparent anesthesia complications

## 2013-03-29 NOTE — Transfer of Care (Signed)
Immediate Anesthesia Transfer of Care Note  Patient: Douglas [name]  Procedure(s) Performed: Procedure(s): LAPAROSCOPIC BILATERAL INGUINAL HERNIA REPAIR (N/A) INSERTION OF MESH (N/A)  Patient Location: PACU  Anesthesia Type:General  Level of Consciousness: awake, alert  and oriented  Airway & Oxygen Therapy: Patient Spontanous Breathing and Patient connected to face mask oxygen  Post-op Assessment: Report given to PACU RN and Post -op Vital signs reviewed and stable  Post vital signs: Reviewed and stable  Complications: No apparent anesthesia complications

## 2013-03-29 NOTE — Interval H&P Note (Signed)
History and Physical Interval Note:  03/29/2013 8:45 AM  Douglas [name]  has presented today for surgery, with the diagnosis of bilateral inguinal hernia  The various methods of treatment have been discussed with the patient and family. After consideration of risks, benefits and other options for treatment, the patient has consented to  Procedure(s): LAPAROSCOPIC BILATERAL INGUINAL HERNIA REPAIR (N/A) INSERTION OF MESH (N/A) as a surgical intervention .  The patient's history has been reviewed, patient examined, no change in status, stable for surgery.  I have reviewed the patient's chart and labs.  Questions were answered to the patient's satisfaction.     [name] A.

## 2013-03-29 NOTE — Anesthesia Preprocedure Evaluation (Signed)
Anesthesia Evaluation  Patient identified by MRN, date of birth, ID band Patient awake    Reviewed: Allergy & Precautions, H&P , NPO status , Patient's Chart, lab work & pertinent test results  Airway Mallampati: III TM Distance: <3 FB Neck ROM: Full    Dental no notable dental hx.    Pulmonary sleep apnea and Continuous Positive Airway Pressure Ventilation ,  breath sounds clear to auscultation  Pulmonary exam normal       Cardiovascular negative cardio ROS  Rhythm:Regular Rate:Normal     Neuro/Psych negative neurological ROS  negative psych ROS   GI/Hepatic negative GI ROS, Neg liver ROS,   Endo/Other  negative endocrine ROS  Renal/GU negative Renal ROS  negative genitourinary   Musculoskeletal negative musculoskeletal ROS (+)   Abdominal   Peds negative pediatric ROS (+)  Hematology negative hematology ROS (+)   Anesthesia Other Findings   Reproductive/Obstetrics negative OB ROS                           Anesthesia Physical Anesthesia Plan  ASA: II  Anesthesia Plan: General   Post-op Pain Management:    Induction: Intravenous  Airway Management Planned: Oral ETT  Additional Equipment:   Intra-op Plan:   Post-operative Plan: Extubation in OR  Informed Consent: I have reviewed the patients History and Physical, chart, labs and discussed the procedure including the risks, benefits and alternatives for the proposed anesthesia with the patient or authorized representative who has indicated his/her understanding and acceptance.   Dental advisory given  Plan Discussed with: CRNA and Surgeon  Anesthesia Plan Comments:         Anesthesia Quick Evaluation

## 2013-03-29 NOTE — Brief Op Note (Signed)
03/29/2013  1:05 PM  PATIENT:  Davonna Belling  37 y.o. male  PRE-OPERATIVE DIAGNOSIS:  bilateral inguinal hernia  POST-OPERATIVE DIAGNOSIS:  bilateral inguinal hernia  PROCEDURE:  Open BIH repair with mesh  Attempted laparoscopic TAP repair bih  SURGEON:  Surgeon(s) and Role:    * Sharlotte Baka A. Dedrick Heffner, MD - Primary    }   ANESTHESIA:   local and general  EBL:  Total I/O In: 2000 [I.V.:2000] Out: 700 [Urine:500; Blood:200]  BLOOD ADMINISTERED:none  DRAINS: none   LOCAL MEDICATIONS USED:  OTHER exparel  SPECIMEN:  No Specimen  DISPOSITION OF SPECIMEN:  N/A  COUNTS:  YES  TOURNIQUET:  * No tourniquets in log *  DICTATION: .Other Dictation: Dictation Number  [id number]  PLAN OF CARE: Admit for overnight observation  PATIENT DISPOSITION:  PACU - hemodynamically stable.   Delay start of Pharmacological VTE agent (>24hrs) due to surgical blood loss or risk of bleeding: no

## 2013-03-29 NOTE — H&P (View-Only) (Signed)
Subjective:     Patient ID: Douglas [name], male   DOB: 08/08/1976, 37 y.o.   MRN: [id number]  HPI Patient presents with chief complaint of left groin pain for 1 year. He underwent repair of left inguinal hernia in 2011 and did well for 2 years. He was picking up a 40 pound box of caval one year ago and had a sudden onset of severe left groin pain. The pain comes and goes and is shooting in nature. Is severe at times. Is made worse with lifting and a sitting. The pain is intermittent. He feels a pulling sensation in his left groin with exertion. There is radiation of pain towards his left hip bone. He was pain-free prior to this since his surgery date in 2011.he returns after CT scanning to discuss results. He still has pain.  Review of Systems  Respiratory: Negative.   Gastrointestinal: Positive for abdominal pain.  Musculoskeletal: Negative.        Objective:   Physical Exam  Constitutional: He appears well-developed and well-nourished.  HENT:  Head: Normocephalic and atraumatic.  Neck: Normal range of motion. Neck supple.  Abdominal:    Findings: There is slight scarring in the left inguinal region from  previous hernia repair. There is no recurrent hernia. There is no  focal soft tissue mass at the site of the patient's soft tissue  bulge.  There is no adenopathy.  The liver, spleen, pancreas, adrenal glands, and, biliary tree, and  kidneys are normal. The bowel is normal including the terminal  ileum and appendix. Prostate gland is not enlarged. No  significant osseous abnormality.  IMPRESSION:  Benign-appearing abdomen and pelvis. Scarring in the left inguinal  region from previous hernia repair. No recurrent hernia or soft  tissue mass.      Assessment:     Left groin pain 3 years after repair of left inguinal hernia with mesh after lifting one year ago. Pain is worsening over the last year. Concern for recurrent left inguinal hernia and possible right inguinal  hernia.    Plan:     Recommend laparoscopic possible bilateral inguinal hernia repair with mesh. Discussed the pros and cons of this procedure. His pain is not improving with conservative measures. Discussed risk of bleeding, infection, injury to blood vessel, organ injury, need for open procedure, recurrence, chronic pain, use of mesh, and the need for other operative intervention. He would  like to proceed. The risk of hernia repair include bleeding,  Infection,   Recurrence of the hernia,  Mesh use, chronic pain,  Organ injury,  Bowel injury,  Bladder injury,   nerve injury with numbness around the incision,  Death,  and worsening of preexisting  medical problems.  The alternatives to surgery have been discussed as well..  Long term expectations of both operative and non operative treatments have been discussed.   The patient agrees to proceed.      

## 2013-03-30 ENCOUNTER — Encounter (HOSPITAL_COMMUNITY): Payer: Self-pay | Admitting: Surgery

## 2013-03-30 LAB — BASIC METABOLIC PANEL
BUN: 11 mg/dL (ref 6–23)
CO2: 29 mEq/L (ref 19–32)
Calcium: 8.3 mg/dL — ABNORMAL LOW (ref 8.4–10.5)
Creatinine, Ser: 0.85 mg/dL (ref 0.50–1.35)
GFR calc Af Amer: >90 mL/min (ref >90)

## 2013-03-30 LAB — CBC
MCHC: 33.9 g/dL (ref 30.0–36.0)
MCV: 89.8 fL (ref 78.0–100.0)
Platelets: 167 10*3/uL (ref 150–400)
RDW: 12.1 % (ref 11.5–15.5)
WBC: 7 10*3/uL (ref 4.0–10.5)

## 2013-03-30 MED ORDER — OXYCODONE-ACETAMINOPHEN 5-325 MG PO TABS: 1.0000 | ORAL_TABLET | ORAL | Status: DC | PRN

## 2013-03-30 NOTE — Care Management Note (Signed)
    Page 1 of 1   03/30/2013     9:57:52 AM   CARE MANAGEMENT NOTE 03/30/2013  Patient:  Douglas [name], Douglas [name]   Account Number:  [id number]  Date Initiated:  03/30/2013  Documentation initiated by:  Lorenda Ishihara  Subjective/Objective Assessment:   37 yo male admitted s/p hernia repair. PTA lived at home with spouse.     Action/Plan:   Home when stable   Anticipated DC Date:  03/30/2013   Anticipated DC Plan:  HOME/SELF CARE      DC Planning Services  CM consult      Choice offered to / List presented to:             Status of service:  Completed, signed off Medicare Important Message given?   (If response is "NO", the following Medicare IM given date fields will be blank) Date Medicare IM given:   Date Additional Medicare IM given:    Discharge Disposition:  HOME/SELF CARE  Per UR Regulation:  Reviewed for med. necessity/level of care/duration of stay  If discussed at Long Length of Stay Meetings, dates discussed:    Comments:

## 2013-03-30 NOTE — Op Note (Signed)
NAMEEVA, VALLEE NO.:  [id number]  MEDICAL RECORD NO.:  [id number]  LOCATION:  1538                         FACILITY:  [facility]  PHYSICIAN:  Maisie Fus A. Jamyra Zweig, M.D.DATE OF BIRTH:  1976/02/10  DATE OF PROCEDURE:  03/29/2013 DATE OF DISCHARGE:                              OPERATIVE REPORT   PREOPERATIVE DIAGNOSES: 1. History of left inguinal hernia repair with mesh four years ago     with recurrent left inguinal pain for one year. 2. Chronic right groin pain for six months.  PROCEDURES: 1. Attempted laparoscopic transabdominal preperitoneal repair of     bilateral inguinal hernia. 2. Open repair to midline incision and bilateral recurrent left     inguinal hernia and very small right direct inguinal hernia with     Ultrapro mesh.  SURGEON:  Maisie Fus A. Danyah Guastella, M.D.  ANESTHESIA:  General endotracheal anesthesia.  20 mL of Exparel was used for the local anesthesia.  ESTIMATED BLOOD LOSS:  200 mL.  IV FLUIDS:  1200 mL of crystalloid.  SPECIMEN:  None.  INDICATIONS FOR PROCEDURE:  The patient is a pleasant 37 year old male who works for a [employer].  He does a lot of heavy lifting and had an open repair of left inguinal hernia back in 2011 and did well for about 2 years.  Last year, he did some heavy lifting and felt a popping sensation in his left groin.  He continued to work through this, but eventually took some time off to rest it and did not improve.  The pain is limiting his ability to work and he has been avoiding all heavy lifting for a couple of months now.  He does not get better.  He now has some mild right groin pain.  CT was obtained, which showed some postoperative changes.  He was quite tender in both inguinal canals. There was concern that he may have a small right inguinal hernia and recurrent left inguinal hernia by examination.  I recommended a laparoscopy as means to look at this and potentially repair as we found. I explained we  may not find anything.  I explained the risk of bleeding, infection, organ injury, need to convert to open procedure, and the need for other procedures, and also that this may not help his pain whatsoever.  The likelihood of this helping him is roughly 50%.  After discussion of the above, he wished to proceed.  He had been tried on nonoperative management of rest and nonsteroidal analgesics.  This has not helped.  DESCRIPTION OF PROCEDURE:  Patient was met in the holding area. Questions were answered.  The patient was then taken back to the operating room and placed supine on the operating room table.  Both arms were tucked after induction of general anesthesia.  Foley cath was placed without difficulty.  The abdomen was then prepped and draped in sterile fashion.  A 1 cm supraumbilical incision was made.  Dissection was carried down to the fascia and the fascia was opened in the midline. The abdominal cavity was entered, and a 12-mm Hasson cannula was placed under direct vision.  Pneumoperitoneum was created with 15 mmHg of CO2. Laparoscope was placed.  The  patient was placed in Trendelenburg.  I placed two 5 mm ports, one in the right lower quadrant and second in the left lower quadrant.  Upon examining the left inguinal canal, there was what appeared to be a small indirect recurrence on the left side.  The right side had a very small direct hernia.  I used cautery to incise the peritoneum above these areas.  The preperitoneal space was then entered and dissected downward.  There was mesh adherent to the peritoneum and I actually had debrided some of the old mesh on the left side to flatten this area out.  Once I was able to develop the space, I developed it all way down to the pubic bone and the Cooper ligament region.  Cord structures were identified and preserved without organ injury.  On the right side, we did some dissection using cautery.  The preperitoneal space was developed.   Cord structures were identified.  The defect was a very small direct defect.  We then elected to place a piece of 5 x 6 Ultrapro mesh on the left side and a similar piece in the right side. This was secured to the abdominal wall with tacks.  Upon tacking on the left side, there was significant bleeding medially.  The bleeding was quite heavy after placement of the tack and could not control it.  After removing the tract, we tried clips and cautery for control.  Given the amount of bleedings from the muscle and inability to control this satisfactorily, I felt that a small incision will be necessary to open this to oversew it and then completed repair with the midline incision. At this point in time, laparoscopic instruments were withdrawn.  CO2 was allowed to escape.  The lower midline incision was made just above the pubic symphysis.  Dissection was carried down until I entered the abdominal cavity.  Upon entering on the left side, there was some oozing from the peritoneum undersurface of the medial aspect of the left rectus muscle that I controlled easily by oversewing with 2-0 Vicryl and the bleeding stopped.  I went ahead and repositioned the mesh on the left side and closed the peritoneum over that taking care not to injure any internal viscera with 2-0 Vicryl to close the mesh without any mesh being exposed.  On the left side, I repositioned the mesh and actually trimmed a small portion off easily.  I lowered this down to cover the direct defect.  It was already secured with small clips from before and I oversewed the peritoneum to protect the mesh from the internal viscera and closed this peritoneal defect with 2-0 Vicryl.  There were no defects where bowel could slide through.  Irrigation was used to suction out.  Hemostasis was then achieved.  We closed the fascia #1 running PDS.  Staples were used to close all skin incisions.  The umbilical port site was closed with 0 Vicryl.  Dry  dressings were applied.  The urine was clear with no signs of bleeding.  Catheter was removed.  The patient was awoke, extubated, and taken to recovery in satisfactory condition. 20 mL of Exparel was used to inject the incision for local anesthesia.     Jhalil Silvera A. Willim Turnage, M.D.     TAC/MEDQ  D:  03/29/2013  T:  03/30/2013  Job:  [id number]

## 2013-03-30 NOTE — Progress Notes (Signed)
1 Day Post-Op  Subjective: Pt sore.    Objective: Vital signs in last 24 hours: Temp:  [97.4 F (36.3 C)-98 F (36.7 C)] 97.9 F (36.6 C) (08/20 0607) Pulse Rate:  [48-87] 63 (08/20 0607) Resp:  [16-20] 16 (08/20 0607) BP: (97-119)/(52-70) 111/70 mmHg (08/20 0607) SpO2:  [97 %-100 %] 100 % (08/20 0607) Weight:  [245 lb (111.131 kg)] 245 lb (111.131 kg) (08/19 1810) Last BM Date: 03/28/13  Intake/Output from previous day: 08/19 0701 - 08/20 0700 In: 5118.3 [P.O.:240; I.V.:4878.3] Out: 2825 [Urine:2625; Blood:200] Intake/Output this shift:    Incision/Wound:clean dry intact incisions swollen groins  Lab Results:   Recent Labs  03/29/13 1544 03/30/13 0400  WBC 11.2* 7.0  HGB 13.0 11.9*  HCT 37.2* 35.1*  PLT 179 167   BMET  Recent Labs  03/29/13 1544 03/30/13 0400  NA  --  137  K  --  4.1  CL  --  103  CO2  --  29  GLUCOSE  --  125*  BUN  --  11  CREATININE 0.85 0.85  CALCIUM  --  8.3*   PT/INR No results found for this basename: LABPROT, INR,  in the last 72 hours ABG No results found for this basename: PHART, PCO2, PO2, HCO3,  in the last 72 hours  Studies/Results: No results found.  Anti-infectives: Anti-infectives   Start     Dose/Rate Route Frequency Ordered Stop   03/29/13 0645  ceFAZolin (ANCEF) IVPB 2 g/50 mL premix     2 g 100 mL/hr over 30 Minutes Intravenous On call to O.R. 03/29/13 0641 03/29/13 0920      Assessment/Plan: s/p Procedure(s): LAPAROSCOPIC BILATERAL INGUINAL HERNIA REPAIR (N/A) INSERTION OF MESH (N/A) Advance diet Discharge  LOS: 1 day    Douglas [name] A. 03/30/2013

## 2013-03-31 ENCOUNTER — Encounter (INDEPENDENT_AMBULATORY_CARE_PROVIDER_SITE_OTHER): Payer: Self-pay | Admitting: Surgery

## 2013-03-31 NOTE — Discharge Summary (Signed)
Admission diagnosis:  Bilateral groin pain and possible recurrent LIH  Discharge diagnosis:  BIH  Date of discharge:  03/30/2013   Date of admission:  03/29/2013  Procedure Performed:  Laparoscopic converted to open BIH repair with mesh.  Hospital course:  Unremarkable.  Discharged POD with stable hemoglobin and good pain control.  Voiding and eating well.  Wounds clean/dry/intact.  Vitals stable.  No current facility-administered medications on file prior to encounter.   Current Outpatient Prescriptions on File Prior to Encounter  Medication Sig Dispense Refill  . diclofenac (VOLTAREN) 75 MG EC tablet Take 75 mg by mouth 2 (two) times daily.       . TESTIM 50 MG/5GM GEL Place 5 g onto the skin daily.       . primidone (MYSOLINE) 50 MG tablet Take 50 mg by mouth at bedtime.           Medication List    STOP taking these medications       HYDROcodone-acetaminophen 5-325 MG per tablet  Commonly known as:  NORCO/VICODIN      TAKE these medications       diclofenac 75 MG EC tablet  Commonly known as:  VOLTAREN  Take 75 mg by mouth 2 (two) times daily.     gabapentin 300 MG capsule  Commonly known as:  NEURONTIN  Take 300 mg by mouth 3 (three) times daily.     oxyCODONE-acetaminophen 5-325 MG per tablet  Commonly known as:  PERCOCET/ROXICET  Take 1-2 tablets by mouth every 4 (four) hours as needed.     primidone 50 MG tablet  Commonly known as:  MYSOLINE  Take 50 mg by mouth at bedtime.     propranolol ER 120 MG 24 hr capsule  Commonly known as:  INDERAL LA  Take 120 mg by mouth every morning.     TESTIM 50 MG/5GM Gel  Generic drug:  testosterone  Place 5 g onto the skin daily.        Follow 7 - 10 days for staple.   Instructions : no lifting.  OOW for 3 months.  Regular diet  Ok to shower.

## 2013-04-01 ENCOUNTER — Other Ambulatory Visit (INDEPENDENT_AMBULATORY_CARE_PROVIDER_SITE_OTHER): Payer: Self-pay | Admitting: Surgery

## 2013-04-04 ENCOUNTER — Encounter (INDEPENDENT_AMBULATORY_CARE_PROVIDER_SITE_OTHER): Payer: Self-pay | Admitting: General Surgery

## 2013-04-04 ENCOUNTER — Ambulatory Visit (INDEPENDENT_AMBULATORY_CARE_PROVIDER_SITE_OTHER): Payer: 59 | Admitting: General Surgery

## 2013-04-04 VITALS — BP 118/66 | HR 72 | Temp 97.9°F | Resp 14 | Ht 74.0 in | Wt 238.8 lb

## 2013-04-04 DIAGNOSIS — K4020 Bilateral inguinal hernia, without obstruction or gangrene, not specified as recurrent: Secondary | ICD-10-CM | POA: Insufficient documentation

## 2013-04-04 MED ORDER — OXYCODONE-ACETAMINOPHEN 5-325 MG PO TABS: 1.0000 | ORAL_TABLET | ORAL | Status: DC | PRN

## 2013-04-04 NOTE — Patient Instructions (Signed)
No heavy lifting  Continue to shower

## 2013-04-05 ENCOUNTER — Encounter (INDEPENDENT_AMBULATORY_CARE_PROVIDER_SITE_OTHER): Payer: Self-pay | Admitting: General Surgery

## 2013-04-05 NOTE — Progress Notes (Signed)
Subjective:     Patient ID: Douglas [name], male   DOB: Apr 05, 1976, 37 y.o.   MRN: [id number]  HPI The pt is a 37yo wm who is one week s/p lap bilateral inguinal hernia repairs with mesh. He comes in today concerned about some drainage from the incisions. He denies any fever. He denies any nausea or vomiting. He still has some significant discomfort of his lower abdominal wall.  Review of Systems     Objective:   Physical Exam On exam all his incisions are healing nicely with no sign of infection. There is no swelling or bruising. His abdomen is soft with some tenderness near the incisions. There are good bowel sounds    Assessment:     The pt is one week s/p lap bilateral inguinal hernia repairs with mesh     Plan:     He appears to be healing well. I have encouraged him to refrain from strenuous activity. He will follow up with Dr. Luisa Hart next week to check his progress

## 2013-04-13 ENCOUNTER — Ambulatory Visit (INDEPENDENT_AMBULATORY_CARE_PROVIDER_SITE_OTHER): Payer: 59 | Admitting: Surgery

## 2013-04-13 ENCOUNTER — Encounter (INDEPENDENT_AMBULATORY_CARE_PROVIDER_SITE_OTHER): Payer: Self-pay | Admitting: Surgery

## 2013-04-13 VITALS — BP 122/80 | HR 68 | Resp 14 | Ht 74.0 in | Wt 238.0 lb

## 2013-04-13 DIAGNOSIS — Z9889 Other specified postprocedural states: Secondary | ICD-10-CM

## 2013-04-13 NOTE — Patient Instructions (Signed)
No lifting.  Ok to shower and drive.  Return 1 month

## 2013-04-14 NOTE — Progress Notes (Signed)
Pt returns to clinic for follow up from open BIH repair.  Sore but doing well.   Wounds are clean and staples removed. RTC  2 weeks.  OOW until at least December.

## 2013-04-29 ENCOUNTER — Ambulatory Visit (INDEPENDENT_AMBULATORY_CARE_PROVIDER_SITE_OTHER): Payer: 59 | Admitting: Surgery

## 2013-04-29 ENCOUNTER — Encounter (INDEPENDENT_AMBULATORY_CARE_PROVIDER_SITE_OTHER): Payer: Self-pay | Admitting: Surgery

## 2013-04-29 VITALS — BP 130/78 | HR 72 | Resp 14 | Ht 74.0 in | Wt 244.2 lb

## 2013-04-29 DIAGNOSIS — G8918 Other acute postprocedural pain: Secondary | ICD-10-CM | POA: Insufficient documentation

## 2013-04-29 MED ORDER — OXYCODONE-ACETAMINOPHEN 5-325 MG PO TABS: 1.0000 | ORAL_TABLET | ORAL | Status: DC | PRN

## 2013-04-29 MED ORDER — TRAMADOL HCL 50 MG PO TABS: 50.0000 mg | ORAL_TABLET | Freq: Two times a day (BID) | ORAL | Status: DC | PRN

## 2013-04-29 MED ORDER — GABAPENTIN 300 MG PO CAPS: 300.0000 mg | ORAL_CAPSULE | Freq: Three times a day (TID) | ORAL | Status: DC

## 2013-04-29 NOTE — Patient Instructions (Signed)
No lifting.  Keep next appt.

## 2013-04-29 NOTE — Progress Notes (Signed)
Patient returns after opening the lateral anal hernia repair with mesh. He was having more right groin pain than before 1 or 2 follow up. He still has shooting pain in his left groin. This is about the same as before. The pain comes and goes and is severe at times even at rest. The pain is a sharp twisting type sensation right greater the left. No fever or chills. He is taking no pain medicine at this point. Bowel and bladder function normal.  Exam: Lower midline abdominal wound healed. Soreness on both sides of this and in both inguinal canals but no evidence of hernia recurrence. Swelling less. No redness.  Impression: Postoperative pain both inguinal canals after open repair of bilateral inguinal  hernia with mesh  Plan: Restart gabapentin 300  mg by mouth 3 times a day. Roxicet 5/325 #20 one every 4 hours as needed. Tramadol 50 mg by mouth every 6 when necessary. Refrain from any lifting. Return in 2 weeks for followup. Pain was likely secondary to scar contraction at this point with no signs of infection. This continues to be an issue, may need CT scan to sort out. No evidence of recurrent hernia.

## 2013-05-13 ENCOUNTER — Ambulatory Visit (INDEPENDENT_AMBULATORY_CARE_PROVIDER_SITE_OTHER): Payer: 59 | Admitting: Surgery

## 2013-05-13 ENCOUNTER — Encounter (INDEPENDENT_AMBULATORY_CARE_PROVIDER_SITE_OTHER): Payer: Self-pay | Admitting: Surgery

## 2013-05-13 VITALS — BP 118/78 | HR 72 | Temp 98.6°F | Resp 14 | Ht 74.0 in | Wt 245.8 lb

## 2013-05-13 DIAGNOSIS — G8918 Other acute postprocedural pain: Secondary | ICD-10-CM

## 2013-05-13 MED ORDER — OXYCODONE-ACETAMINOPHEN 5-325 MG PO TABS: 1.0000 | ORAL_TABLET | ORAL | Status: DC | PRN

## 2013-05-13 NOTE — Progress Notes (Signed)
Patient returns after open BILATERAL INGUINAL hernia repair with mesh. He continues to have pain left side greater than right side. He is quite down about this. He states he has not been taking his gabapentin 3 times a day.Marland Kitchen He still has shooting pain in his left groin. This is about the same as before. The pain comes and goes and is severe at times even at rest. The pain is a sharp twisting type sensation on the left. No fever or chills. He is taking no pain medicine at this point. Bowel and bladder function normal.Right side feels better.   Exam: Lower midline abdominal wound healed. Soreness on both sides of this and in both inguinal canals but no evidence of hernia recurrence. Swelling less. No redness.left scar left groin tender. No hernia recurrence. No paresthesias over inguinal skin noted bilaterally.  Impression: Postoperative pain both inguinal canals after open repair of bilateral inguinal hernia with left side recurrent.   Plan: Restart gabapentin 300  mg by mouth 3 times a day. Roxicet 5/325 #20 one every 4 hours as needed. Tramadol 50 mg by mouth every 6 when necessary. Refrain from any lifting. Return in 4 weeks for followup. His pain is becoming more for chronic issue now. It's about the same as it was prior to surgery. Right side is better the left side continues to have intermittent shooting pain which is limiting his activity and ability to work. He'll need to be out of work through the end of December at this point. Reevaluate in 4 weeks. The gabapentin does help him from the pain but is not taking it as directed. Explained to him to go ahead and take this 3 times a day. May try Lexapro if this fails. He may require referral to chronic pain clinic but this decision to do that. Continue to encourage him to walk and watch his carbohydrate intake.

## 2013-05-13 NOTE — Patient Instructions (Signed)
Take gabapentin 3 times a day.  Ok to walk.  No lifting.  Return 3 weeks.      High Protein Diet A high protein diet means that high protein foods are added to your diet. Getting more protein in the diet is important for a number of reasons. Protein helps the body to build tissue, muscle, and to repair damage. People who have had surgery, injuries such as broken bones, infections, and burns, or illnesses such as cancer, may need more protein in their diet.  SERVING SIZES Measuring foods and serving sizes helps to make sure you are getting the right amount of food. The list below tells how big or small some common serving sizes are.   1 oz.........4 stacked dice.  3 oz........Marland KitchenDeck of cards.  1 tsp.......Marland KitchenTip of little finger.  1 tbs......Marland KitchenMarland KitchenThumb.  2 tbs.......Marland KitchenGolf ball.   cup......Marland KitchenHalf of a fist.  1 cup.......Marland KitchenA fist. FOOD SOURCES OF PROTEIN Listed below are some food sources of protein and the amount of protein they contain. Your Registered Dietitian can calculate how many grams of protein you need for your medical condition. High protein foods can be added to the diet at mealtime or as snacks. Be sure to have at least 1 protein-containing food at each meal and snack to ensure adequate intake.  Meats and Meat Substitutes / Protein (g)  3 oz poultry (chicken, [place]) / 26 g  3 oz tuna, canned in water / 26 g  3 oz fish (cod) / 21 g  3 oz red meat (beef, pork) / 21 g  4 oz tofu / 9 g  1 egg / 6 g   cup egg substitute / 5 g  1 cup dried beans / 15 g  1 cup soy milk / 4 g Dairy / Protein (g)  1 cup milk (skim, 1%, 2%, whole) / 8 g   cup evaporated milk / 9 g  1 cup buttermilk / 8 g  1 cup low-fat plain yogurt / 11 g  1 cup regular plain yogurt / 9 g   cup cottage cheese / 14 g  1 oz cheddar cheese / 7 g Nuts / Protein (g)  2 tbs peanut butter / 8 g  1 oz peanuts / 7 g  2 tbs cashews / 5 g  2 tbs almonds / 5 g Document Released: 07/28/2005  Document Revised: 10/20/2011 Document Reviewed: 04/30/2007 [employer] Patient Information 2014 [place], [place].

## 2013-05-27 ENCOUNTER — Telehealth (INDEPENDENT_AMBULATORY_CARE_PROVIDER_SITE_OTHER): Payer: Self-pay

## 2013-05-27 NOTE — Telephone Encounter (Signed)
Message copied by Brennan Bailey on Fri May 27, 2013  1:45 PM ------      Message from: Leanne Chang      Created: Fri May 27, 2013  8:40 AM      Regarding: Dr Luisa Hart      Contact: [phone]       Need rx refill (gabapenpin 300 mg). Fax to: [facility] phone# [phone]. Order# [id number]. Fax# [phone]. Patient can also be May call on cell# [phone] ------

## 2013-05-27 NOTE — Telephone Encounter (Signed)
Let pt know refill was faxed and I received confirmation they got the fax.

## 2013-06-03 ENCOUNTER — Ambulatory Visit (INDEPENDENT_AMBULATORY_CARE_PROVIDER_SITE_OTHER): Payer: 59 | Admitting: Surgery

## 2013-06-03 ENCOUNTER — Encounter (INDEPENDENT_AMBULATORY_CARE_PROVIDER_SITE_OTHER): Payer: Self-pay | Admitting: Surgery

## 2013-06-03 VITALS — BP 120/80 | HR 64 | Temp 97.0°F | Resp 14 | Ht 74.0 in | Wt 245.8 lb

## 2013-06-03 DIAGNOSIS — G8918 Other acute postprocedural pain: Secondary | ICD-10-CM

## 2013-06-03 MED ORDER — TRAMADOL HCL 50 MG PO TABS: 50.0000 mg | ORAL_TABLET | Freq: Two times a day (BID) | ORAL | Status: DC | PRN

## 2013-06-03 MED ORDER — OXYCODONE-ACETAMINOPHEN 5-325 MG PO TABS: 1.0000 | ORAL_TABLET | ORAL | Status: AC | PRN

## 2013-06-03 MED ORDER — OXYCODONE-ACETAMINOPHEN 5-325 MG PO TABS: 1.0000 | ORAL_TABLET | ORAL | Status: DC | PRN

## 2013-06-03 NOTE — Progress Notes (Signed)
Patient returns after open BILATERAL INGUINAL hernia repair with mesh. He continues to have pain left side greater than right side. He is quite down about this.. He still has shooting pain in his left groin. This is about the same as before. The pain comes and goes and is severe at times even at rest. The pain is a sharp twisting type sensation on the left. No fever or chills. He is taking no pain medicine at this point. Bowel and bladder function normal.Right side feels better.   Exam: Lower midline abdominal wound healed. Soreness on both sides of this and in both inguinal canals but no evidence of hernia recurrence. Swelling less. No redness.left scar left groin tender. No hernia recurrence. No paresthesias over inguinal skin noted bilaterally.  Impression: Postoperative pain both inguinal canals after open repair of bilateral inguinal hernia with left side recurrent.   Plan: Restart gabapentin 300  mg by mouth 3 times a day. Roxicet 5/325 #20 one every 4 hours as needed. Tramadol 50 mg by mouth every 6 when necessary. Refrain from any lifting. Return in 4 weeks for followup. His pain is becoming more for chronic issue now. It's about the same as it was prior to surgery. Right side is better the left side continues to have intermittent shooting pain which is limiting his activity and ability to work. He'll need to be out of work through the end of December at this point. Reevaluate in 4 weeks. The gabapentin does help him from the pain.  At this point, referral to chronic pain clinic is necessary and he agrees to do this. I had a lengthy discussion with him today about chronic pain. He will continue his present medicine regimen. Pain clinic may help him better control his discomfort to improve his overall function. Return to office in 4 weeks for reevaluation. I refilled his Percocet and tramadol. He will continue his gabapentin since this seems to help.. Continue to encourage him to walk and watch his  carbohydrate intake.

## 2013-06-03 NOTE — Patient Instructions (Signed)
will refer to pain clinic.

## 2013-06-16 ENCOUNTER — Other Ambulatory Visit: Payer: Self-pay

## 2013-06-20 ENCOUNTER — Telehealth (INDEPENDENT_AMBULATORY_CARE_PROVIDER_SITE_OTHER): Payer: Self-pay | Admitting: *Deleted

## 2013-06-20 NOTE — Telephone Encounter (Signed)
Kelly from [facility] call and stated that the patient cancelled his referral to them; however, pt does have an appt at [facility] on 06/29/13 @ 11:00am.

## 2013-07-04 ENCOUNTER — Encounter (INDEPENDENT_AMBULATORY_CARE_PROVIDER_SITE_OTHER): Payer: 59 | Admitting: Surgery

## 2013-07-11 ENCOUNTER — Ambulatory Visit (INDEPENDENT_AMBULATORY_CARE_PROVIDER_SITE_OTHER): Payer: 59 | Admitting: Surgery

## 2013-07-11 ENCOUNTER — Encounter (INDEPENDENT_AMBULATORY_CARE_PROVIDER_SITE_OTHER): Payer: Self-pay | Admitting: Surgery

## 2013-07-11 VITALS — BP 114/66 | HR 60 | Temp 98.5°F | Resp 14 | Ht 74.0 in | Wt 252.4 lb

## 2013-07-11 DIAGNOSIS — G8918 Other acute postprocedural pain: Secondary | ICD-10-CM

## 2013-07-11 NOTE — Patient Instructions (Signed)
Increase activity slowly.  Return 1 month. Out of work.

## 2013-07-11 NOTE — Progress Notes (Signed)
Patient returns for postop followup for chronic left groin pain after anal hernia surgery and repair of recurrent left and new right inguinal hernia two and  half months ago.he went to a pain clinic and they did a left inguinal block which helped a lot. He is cut down his pain medicine. He feels a lot better. He is off most of his pain medication except for an occasional Percocet.  Exam: Not repeated today.  Impression: Chronic left groin pain after initial left inguinal hernia repair to use ago and subsequent open repair of bilateral inguinal hernia with postop pain similar to his preop pain and left groin.   Plan: He feels a lot better after the block. We'll keep him out of work until January 2015 and reassess in next month. If he is relatively pain-free I will release him back to work at that time.

## 2013-08-11 HISTORY — PX: INTERCOSTAL NERVE BLOCK: SHX5021

## 2013-08-15 ENCOUNTER — Ambulatory Visit (INDEPENDENT_AMBULATORY_CARE_PROVIDER_SITE_OTHER): Payer: 59 | Admitting: Surgery

## 2013-08-15 ENCOUNTER — Encounter (INDEPENDENT_AMBULATORY_CARE_PROVIDER_SITE_OTHER): Payer: Self-pay | Admitting: Surgery

## 2013-08-15 VITALS — BP 112/68 | HR 54 | Temp 97.3°F | Resp 16 | Ht 74.0 in | Wt 251.8 lb

## 2013-08-15 DIAGNOSIS — G8918 Other acute postprocedural pain: Secondary | ICD-10-CM

## 2013-08-15 NOTE — Progress Notes (Signed)
Patient returns for postop followup for chronic left groin pain after anal hernia surgery and repair of recurrent left and new right inguinal hernia three  and  half months ago.he went to a pain clinic and they did a left inguinal block which helped a lot. He is cut down his pain medicine. He feels a lot better. He is off most of his pain medication except for an occasional Percocet.  Exam: inguinal canals well healed.   Midline scar well healed.  No tenderness or recurrent hernias.   Impression: Chronic left groin pain after initial left inguinal hernia repair to use ago and subsequent open repair of bilateral inguinal hernia with postop pain similar to his preop pain and left groin.   Plan: He feels a lot better after the block. Return to work without restrictions.  Continue pain clinic follow up as needed.  Follow up here as needed.

## 2013-08-15 NOTE — Patient Instructions (Signed)
Return to full activity.

## 2014-05-11 ENCOUNTER — Encounter: Payer: Self-pay | Admitting: Neurology

## 2014-05-11 ENCOUNTER — Ambulatory Visit (INDEPENDENT_AMBULATORY_CARE_PROVIDER_SITE_OTHER): Payer: 59 | Admitting: Neurology

## 2014-05-11 VITALS — BP 117/81 | HR 50 | Temp 97.8°F | Ht 74.0 in | Wt 262.6 lb

## 2014-05-11 DIAGNOSIS — Z79899 Other long term (current) drug therapy: Secondary | ICD-10-CM

## 2014-05-11 DIAGNOSIS — G44209 Tension-type headache, unspecified, not intractable: Secondary | ICD-10-CM

## 2014-05-11 DIAGNOSIS — G44221 Chronic tension-type headache, intractable: Secondary | ICD-10-CM

## 2014-05-11 MED ORDER — AMITRIPTYLINE HCL 25 MG PO TABS: 100.0000 mg | ORAL_TABLET | Freq: Every day | ORAL | Status: DC

## 2014-05-11 NOTE — Progress Notes (Addendum)
[facility]    Provider:  Dr Jaynee Eagles Referring Provider: Chesley Noon, MD Primary Care Physician:  Douglas Noon, MD  CC:  Headache  HPI:  Douglas [name] is a 38 y.o. male here as a referral from Dr. Melford [name] for Headache. He reports worsening headaches for several months. No previous history of significant headaches. In the last few months he has been having headaches, whole top of the head pounds, getting worse and makes him cry. The pain can be severe, 10/10. The headache is constant and lasts all day, occurs the majority of days in the month. He has sleep apnea and is using the mask consistently every night and follows with a sleep doctor. Headaches start after he wakes up and lasts all day. No light sensitivity, sound sensitivity, nausea. No vision changes, no focal neurologic symptoms. Unknown triggers. Feels like a vice on the head. No hearing changes. These are worsening, never had these type of headaches before. Brother had a stroke and he has a family history of brain tumors. Nothing making it better, possibly at its best when waking up. Getting more frequent, longer and more severe.   Review of Systems: Patient complains of symptoms per HPI as well as the following symptoms headache. No CP, Palpitations, rash. Pertinent negatives per HPI. All others negative.   History   Social History  . Marital Status: Married    Spouse Name: Collie Siad    Number of Children: 2  . Years of Education: GED   Occupational History  .      [employer]   Social History Main Topics  . Smoking status: Former Smoker -- 1.00 packs/day for 8 years    Types: Cigarettes    Quit date: 02/07/1994  . Smokeless tobacco: Never Used  . Alcohol Use: 0.0 oz/week     Comment: occasional 1-2 drinks/ month  . Drug Use: No  . Sexual Activity: Not on file   Other Topics Concern  . Not on file   Social History Narrative   Patient lives at home with wife.   Caffeine Use: 2 cups  coffee daily    Family History  Problem Relation Age of Onset  . Tremor Brother   . Brain cancer Maternal Grandfather     Past Medical History  Diagnosis Date  . Arthritis   . Headache(784.0)     cluster headache  . Sleep apnea     cpap setting of 6  . Neuromuscular disorder     tremors  . GERD (gastroesophageal reflux disease)     occasional    Past Surgical History  Procedure Laterality Date  . Carpal tunnel release Bilateral  june left, right july 2013  . Hernia repair  2011  . Inguinal hernia repair N/A 03/29/2013    Procedure: LAPAROSCOPIC BILATERAL INGUINAL HERNIA REPAIR;  Surgeon: Douglas Faster. Cornett, MD;  Location: [facility];  Service: General;  Laterality: N/A;  . Insertion of mesh N/A 03/29/2013    Procedure: INSERTION OF MESH;  Surgeon: Douglas Faster. Cornett, MD;  Location: [facility];  Service: General;  Laterality: N/A;  . Intercostal nerve block  2015    Current Outpatient Prescriptions  Medication Sig Dispense Refill  . oxyCODONE-acetaminophen (ROXICET) 5-325 MG per tablet Take 1 tablet by mouth every 4 (four) hours as needed for pain.  40 tablet  0  . primidone (MYSOLINE) 50 MG tablet Take 50 mg by mouth at bedtime.       . propranolol  ER (INDERAL LA) 120 MG 24 hr capsule Take 120 mg by mouth every morning.      Marland Kitchen amitriptyline (ELAVIL) 25 MG tablet Take 4 tablets (100 mg total) by mouth at bedtime. Start with one tablet at night. Can increase by one tab every 3-4 days until taking 4 tabs (100mg ) before bed.  120 tablet  3   No current facility-administered medications for this visit.    Allergies as of 05/11/2014 - Review Complete 05/11/2014  Allergen Reaction Noted  . Sulfa drugs cross reactors Hives 07/15/2011    Vitals: BP 117/81  Pulse 50  Temp(Src) 97.8 F (36.6 C) (Oral)  Ht 6\' 2"  (1.88 m)  Wt 262 lb 9.6 oz (119.115 kg)  BMI 33.70 kg/m2 Last Weight:  Wt Readings from Last 1 Encounters:  05/11/14 262 lb 9.6 oz (119.115 kg)   Last Height:   Ht  Readings from Last 1 Encounters:  05/11/14 6\' 2"  (1.88 m)   Physical exam: Exam: Gen: NAD, conversant, well nourised, obese, well groomed                     CV: RRR, no MRG. No Carotid Bruits. No peripheral edema, warm, nontender Eyes: Conjunctivae clear without exudates or hemorrhage  Neuro: Detailed Neurologic Exam  Speech:    Speech is normal; fluent and spontaneous with normal comprehension.  Cognition:    The patient is oriented to person, place, and time;     recent and remote memory intact;     language fluent;     normal attention, concentration,     fund of knowledge Cranial Nerves:    The pupils are equal, round, and reactive to light. The fundi are normal and spontaneous venous pulsations are present. Visual fields are full to finger confrontation. Extraocular movements are intact. Trigeminal sensation is intact and the muscles of mastication are normal. The face is symmetric. The palate elevates in the midline. Voice is normal. Shoulder shrug is normal. The tongue has normal motion without fasciculations.   Coordination:    Normal finger to nose and heel to shin. Normal rapid alternating movements.   Gait:    Heel-toe and tandem gait are normal.   Motor Observation:    No asymmetry, no atrophy, and no involuntary movements noted. Tone:    Normal muscle tone.    Posture:    Posture is normal. normal erect    Strength:    Strength is V/V in the upper and lower limbs.      Sensation: intact     Reflex Exam:  DTR's:    Deep tendon reflexes in the upper and lower extremities are normal bilaterally.   Toes:    The toes are downgoing bilaterally.   Clonus:    Clonus is absent.  Physical exam: Exam: Gen: NAD, conversant, well nourised, overweight, well groomed                     CV: RRR, no MRG. No Carotid Bruits. No peripheral edema, warm, nontender Eyes: Conjunctivae clear without exudates or hemorrhage  Neuro: Detailed Neurologic Exam  Speech:     Speech is normal; fluent and spontaneous with normal comprehension.  Cognition:    The patient is oriented to person, place, and time;     recent and remote memory intact;     language fluent;     normal attention, concentration,     fund of knowledge Cranial Nerves:    The pupils  are equal, round, and reactive to light. The fundi are normal and spontaneous venous pulsations are present. Visual fields are full to finger confrontation. Extraocular movements are intact. Trigeminal sensation is intact and the muscles of mastication are normal. The face is symmetric. The palate elevates in the midline. Voice is normal. Shoulder shrug is normal. The tongue has normal motion without fasciculations.   Coordination:    Normal finger to nose and heel to shin. Normal rapid alternating movements.   Gait:    Heel-toe and tandem gait are normal.   Motor Observation:    No asymmetry, no atrophy, and no involuntary movements noted. Tone:    Normal muscle tone.    Posture:    Posture is normal. normal erect    Strength:    Strength is V/V in the upper and lower limbs.      Sensation: intact     Reflex Exam:  DTR's:    Deep tendon reflexes in the upper and lower extremities are normal bilaterally.   Toes:    The toes are downgoing bilaterally.   Clonus:    Clonus is absent.      Assessment/Plan:  38 year old male with new onset severe daily tension type daily headaches. Neuro exam is normal. Will start Amitriptyline qhs, discussed most common side effects. EKG of the heart to assess for QT abnormalities, patient understands he should have EKG performed before starting medication to avoid mortality and morbidity from heart arythmias. MRI of the brain due to new onset headache. Follow up in 3 months.   Sarina Ill, MD  [facility] [address] [place], [place] [address]  Phone [phone] Fax [phone]  Lenor Coffin

## 2014-05-11 NOTE — Patient Instructions (Signed)
Overall you are doing fairly well but I do want to suggest a few things today:   Remember to drink plenty of fluid, eat healthy meals and do not skip any meals. Try to eat protein with a every meal and eat a healthy snack such as fruit or nuts in between meals. Try to keep a regular sleep-wake schedule and try to exercise daily, particularly in the form of walking, 20-30 minutes a day, if you can.   As far as your medications are concerned, I would like to suggest: Amitriptyline before bed as prescribed  As far as diagnostic testing: MRI of the brain (we will call you) and EKG of heart  I would like to see you back in 3 months, sooner if we need to. Please call us with any interim questions, concerns, problems, updates or refill requests.   Please also call us for any test results so we can go over those with you on the phone.  My clinical assistant and will answer any of your questions and relay your messages to me and also relay most of my messages to you.   Our phone number is [phone]. We also have an after hours call service for urgent matters and there is a physician on-call for urgent questions. For any emergencies you know to call 911 or go to the nearest emergency room

## 2014-05-14 ENCOUNTER — Encounter: Payer: Self-pay | Admitting: Neurology

## 2014-05-15 DIAGNOSIS — G44209 Tension-type headache, unspecified, not intractable: Secondary | ICD-10-CM | POA: Insufficient documentation

## 2014-05-17 ENCOUNTER — Ambulatory Visit (INDEPENDENT_AMBULATORY_CARE_PROVIDER_SITE_OTHER): Payer: 59

## 2014-05-17 DIAGNOSIS — G44221 Chronic tension-type headache, intractable: Secondary | ICD-10-CM

## 2014-05-19 ENCOUNTER — Telehealth: Payer: Self-pay | Admitting: Neurology

## 2014-05-19 NOTE — Addendum Note (Signed)
Addended by: Sarina Ill B on: 05/19/2014 11:47 PM   Modules accepted: Orders

## 2014-05-19 NOTE — Telephone Encounter (Signed)
Would you please call patient and ask if he went and got an EKG? I ordered it and discussed it with him. Please inform him where he can go to get it, thanks.

## 2014-05-22 NOTE — Telephone Encounter (Signed)
Spoke to patient. He says he has not had a EKG done. He is anxious to get his MRI results also.

## 2014-05-22 NOTE — Telephone Encounter (Signed)
Spoke to patient regarding his MRI results. thanks

## 2014-06-07 ENCOUNTER — Telehealth: Payer: Self-pay | Admitting: Neurology

## 2014-06-07 NOTE — Telephone Encounter (Signed)
Patient is calling to follow up on scheduling an EKG. Please call.

## 2014-06-12 ENCOUNTER — Telehealth: Payer: Self-pay | Admitting: Neurology

## 2014-06-12 ENCOUNTER — Other Ambulatory Visit: Payer: Self-pay

## 2014-06-12 DIAGNOSIS — Z79899 Other long term (current) drug therapy: Secondary | ICD-10-CM

## 2014-06-12 NOTE — Telephone Encounter (Signed)
Patient feels tremors have worsened since starting Elavil.  He would like to know if a different medication could be recommended/prescribed.  Please advise.  Thank you.

## 2014-06-12 NOTE — Telephone Encounter (Signed)
Done. Called patient and advised order faxed to [facility] and he should be receiving a call from them. Provided their phone number also.

## 2014-06-12 NOTE — Telephone Encounter (Signed)
Please order for me and forward to [place] thanks

## 2014-06-12 NOTE — Telephone Encounter (Signed)
Patient stated Rx amitriptyline (ELAVIL) 25 MG tablet has made tremors worse.  Questioning if there's an alternative medication.  Please call and advise.

## 2014-06-13 ENCOUNTER — Other Ambulatory Visit: Payer: Self-pay | Admitting: Neurology

## 2014-06-13 DIAGNOSIS — G441 Vascular headache, not elsewhere classified: Secondary | ICD-10-CM

## 2014-06-13 MED ORDER — TOPIRAMATE ER 50 MG PO CAP24: 100.0000 mg | ORAL_CAPSULE | Freq: Every day | ORAL | Status: DC

## 2014-06-13 NOTE — Progress Notes (Signed)
Elavil helped but worsened his essential tremor. Will slowly decrease and stop elavil over a week and start topiramate ER. Discussed directions for elavil titration and side effects of trokendi with patient.

## 2014-06-16 ENCOUNTER — Telehealth: Payer: Self-pay

## 2014-06-16 ENCOUNTER — Other Ambulatory Visit: Payer: Self-pay | Admitting: Neurology

## 2014-06-16 DIAGNOSIS — G441 Vascular headache, not elsewhere classified: Secondary | ICD-10-CM

## 2014-06-16 MED ORDER — TOPIRAMATE 25 MG PO TABS: 50.0000 mg | ORAL_TABLET | Freq: Two times a day (BID) | ORAL | Status: DC

## 2014-06-16 NOTE — Telephone Encounter (Signed)
I called home number.  Patient was not available.  They asked that I call cell.  I called cell, got no answer.  Left message.

## 2014-06-16 NOTE — Telephone Encounter (Signed)
[facility]) sent [place] a letter saying they are not able to approve coverage on Trokendi XR.  They state this drug is "excluded" from the patient's policy, and therefore there is no coverage criteria available.  They will, however, cover Topiramate.  Please advise.  Thank you.

## 2014-06-16 NOTE — Telephone Encounter (Signed)
Douglas [name] - can u tell him that I will call him in Topiramate this afternoon. It is the same drug except he has to take this twice daily.  If he still wants to talk to me, let me know. thanks

## 2014-07-12 ENCOUNTER — Encounter: Payer: Self-pay | Admitting: Neurology

## 2014-07-16 ENCOUNTER — Encounter: Payer: Self-pay | Admitting: Neurology

## 2014-07-27 ENCOUNTER — Encounter: Payer: Self-pay | Admitting: Neurology

## 2014-07-27 ENCOUNTER — Ambulatory Visit (INDEPENDENT_AMBULATORY_CARE_PROVIDER_SITE_OTHER): Payer: 59 | Admitting: Neurology

## 2014-07-27 VITALS — BP 100/66 | HR 56 | Ht 74.02 in | Wt 254.4 lb

## 2014-07-27 DIAGNOSIS — G43709 Chronic migraine without aura, not intractable, without status migrainosus: Secondary | ICD-10-CM

## 2014-07-27 DIAGNOSIS — G44229 Chronic tension-type headache, not intractable: Secondary | ICD-10-CM

## 2014-07-27 MED ORDER — TOPIRAMATE ER 100 MG PO CAP24: 100.0000 mg | ORAL_CAPSULE | Freq: Every day | ORAL | Status: DC

## 2014-07-27 NOTE — Progress Notes (Addendum)
[facility]    Provider:  Dr Jaynee Eagles Referring Provider: Chesley Noon, MD Primary Care Physician:  Chesley Noon, MD  CC:  Headache  HPI:  Douglas [name] is a 38 y.o. male here as a follow up for tension-type headache.    07/27/2014 interval history: He continues to have headaches. Failed Amitriptyline. Tried OTC meds. Tried percocet, diclofenac, propranolol and other prescriptions.  Topamax is helping  but is having side effects of tingling and cognitive changes. He is using the cpap every night except his CPAP isn't working well and he is still snoring all night long. He is going  to get a new machine. It has been a while sine the cpap machine has not worked because wife has been waking him up. Chocolate appears to be a headache trigger. He is very tired. Also experiencing cognitive changes.  05/11/2014 initial encounter:  HPI: Douglas [name] is a 38 y.o. male here as a referral from Dr. Melford Aase for Headache. He reports worsening headaches for several months. No previous history of significant headaches. In the last few months he has been having headaches, whole top of the head pounds, getting worse and makes him cry. The pain can be severe, 10/10. The headache is constant and lasts all day, occurs the majority of days in the month. He has sleep apnea and is using the mask consistently every night and follows with a sleep doctor. Headaches start after he wakes up and lasts all day. Endorses light sensitivity, sound sensitivity, nausea. No vision changes, no focal neurologic symptoms. Unknown triggers. Feels like a vice on the head. No hearing changes. These are worsening, never had these type of headaches before. Brother had a stroke and he has a family history of brain tumors. Nothing making it better, possibly at its best when waking up. Getting more frequent, longer and more severe.    Review of Systems: Patient complains of symptoms per HPI as well as the  following symptoms: appetite change. Pertinent negatives per HPI. All others negative.   History   Social History  . Marital Status: Married    Spouse Name: Collie Siad    Number of Children: 2  . Years of Education: GED   Occupational History  .      [employer]   Social History Main Topics  . Smoking status: Former Smoker -- 1.00 packs/day for 8 years    Types: Cigarettes    Quit date: 02/07/1994  . Smokeless tobacco: Never Used  . Alcohol Use: 0.0 oz/week     Comment: occasional 1-2 drinks/ month  . Drug Use: No  . Sexual Activity: Not on file   Other Topics Concern  . Not on file   Social History Narrative   Patient lives at home with wife.   Caffeine Use: 2 cups coffee daily   Patient has his GED    Patient has 2 children    Patient is right handed     Family History  Problem Relation Age of Onset  . Tremor Brother   . Brain cancer Maternal Grandfather     Past Medical History  Diagnosis Date  . Arthritis   . Headache(784.0)     cluster headache  . Sleep apnea     cpap setting of 6  . Neuromuscular disorder     tremors  . GERD (gastroesophageal reflux disease)     occasional    Past Surgical History  Procedure Laterality Date  . Carpal tunnel release  Bilateral  june left, right july 2013  . Hernia repair  2011  . Inguinal hernia repair N/A 03/29/2013    Procedure: LAPAROSCOPIC BILATERAL INGUINAL HERNIA REPAIR;  Surgeon: Joyice Faster. Cornett, MD;  Location: [facility];  Service: General;  Laterality: N/A;  . Insertion of mesh N/A 03/29/2013    Procedure: INSERTION OF MESH;  Surgeon: Joyice Faster. Cornett, MD;  Location: [facility];  Service: General;  Laterality: N/A;  . Intercostal nerve block  2015    Current Outpatient Prescriptions  Medication Sig Dispense Refill  . oxyCODONE-acetaminophen (PERCOCET/ROXICET) 5-325 MG per tablet Take 1 tablet by mouth as needed.  0  . primidone (MYSOLINE) 50 MG tablet Take 50 mg by mouth at bedtime.     . propranolol ER (INDERAL  LA) 120 MG 24 hr capsule Take 120 mg by mouth every morning.    . topiramate (TOPAMAX) 25 MG tablet Take 2 tablets (50 mg total) by mouth 2 (two) times daily. Start with 25mg  twice daily for 7 days then increase to 50mg  twice daily. 120 tablet 6   No current facility-administered medications for this visit.    Allergies as of 07/27/2014 - Review Complete 07/27/2014  Allergen Reaction Noted  . Sulfa drugs cross reactors Hives 07/15/2011    Vitals: BP 100/66 mmHg  Pulse 56  Ht 6' 2.02" (1.88 m)  Wt 254 lb 6.4 oz (115.395 kg)  BMI 32.65 kg/m2 Last Weight:  Wt Readings from Last 1 Encounters:  07/27/14 254 lb 6.4 oz (115.395 kg)   Last Height:   Ht Readings from Last 1 Encounters:  07/27/14 6' 2.02" (1.88 m)   Physical exam: Exam: Gen: NAD, conversant, well nourised, obese, well groomed                     CV: RRR, no MRG. No Carotid Bruits. No peripheral edema, warm, nontender Eyes: Conjunctivae clear without exudates or hemorrhage  Neuro: Detailed Neurologic Exam  Speech:    Speech is normal; fluent and spontaneous with normal comprehension.  Cognition:    The patient is oriented to person, place, and time;     recent and remote memory intact;     language fluent;     normal attention, concentration,     fund of knowledge Cranial Nerves:    The pupils are equal, round, and reactive to light. The fundi are normal and spontaneous venous pulsations are present. Visual fields are full to finger confrontation. Extraocular movements are intact. Trigeminal sensation is intact and the muscles of mastication are normal. The face is symmetric. The palate elevates in the midline. Voice is normal. Shoulder shrug is normal. The tongue has normal motion without fasciculations.   Coordination:    Normal finger to nose and heel to shin. Normal rapid alternating movements.   Gait:    Heel-toe and tandem gait are normal.   Motor Observation:    No asymmetry, no atrophy, and no  involuntary movements noted. Tone:    Normal muscle tone.    Posture:    Posture is normal. normal erect    Strength:    Strength is V/V in the upper and lower limbs.      Sensation: intact        Assessment/Plan:  38 year old male with new onset severe daily tension type daily headaches. Neuro exam is normal. Did not tolerate Amitriptyline. Topamax helps a little but having side effects. Today he tells me his cpap machine has not been  working well for a long time. He has OSA. Given headache, cognitive changes, drowsiness suspect symptoms may be due to untreated OSA. He can't tolerate Topiramate however maybe Trokendi, a different formulation, would be better tolerated or possibly Zonisamide(sulfa allergy). Depakote may be an option. Will try to witch to Trokendi since he can't tolerate generic topiramate but he needs to get his cpap machien fixed or get a new one.    Sarina Ill, MD  [facility] [address] [place], [place] [address]  Phone [phone] Fax [phone] Lenor Coffin

## 2014-08-01 ENCOUNTER — Encounter: Payer: Self-pay | Admitting: *Deleted

## 2014-08-02 ENCOUNTER — Other Ambulatory Visit: Payer: Self-pay | Admitting: Physician Assistant

## 2014-08-02 ENCOUNTER — Ambulatory Visit
Admission: RE | Admit: 2014-08-02 | Discharge: 2014-08-02 | Disposition: A | Discharge status: Home / Self Care | Payer: 59 | Source: Ambulatory Visit | Attending: Physician Assistant | Admitting: Physician Assistant

## 2014-08-02 DIAGNOSIS — M545 Low back pain: Secondary | ICD-10-CM

## 2014-08-03 ENCOUNTER — Encounter: Payer: Self-pay | Admitting: Neurology

## 2014-08-07 ENCOUNTER — Telehealth: Payer: Self-pay | Admitting: Neurology

## 2014-08-07 NOTE — Telephone Encounter (Signed)
Patient stated samples of Trokendi aren't helping with Migraines.  Also new CPAP machine isn't beneficial at all.  Please call and advise.

## 2014-08-08 ENCOUNTER — Other Ambulatory Visit: Payer: Self-pay | Admitting: Neurology

## 2014-08-08 MED ORDER — ZONISAMIDE 100 MG PO CAPS: 100.0000 mg | ORAL_CAPSULE | Freq: Every day | ORAL | Status: DC

## 2014-08-09 ENCOUNTER — Other Ambulatory Visit: Payer: Self-pay | Admitting: Neurology

## 2014-08-09 DIAGNOSIS — G44229 Chronic tension-type headache, not intractable: Secondary | ICD-10-CM

## 2014-08-09 MED ORDER — DIVALPROEX SODIUM ER 500 MG PO TB24: 500.0000 mg | ORAL_TABLET | Freq: Every day | ORAL | Status: DC

## 2014-08-09 NOTE — Telephone Encounter (Signed)
Will try Depakote. Prescribed 500mg  ER daily and can increase from there.

## 2014-08-17 ENCOUNTER — Ambulatory Visit: Payer: 59 | Admitting: Neurology

## 2014-08-28 ENCOUNTER — Other Ambulatory Visit: Payer: Self-pay | Admitting: Pain Medicine

## 2014-08-28 DIAGNOSIS — M79605 Pain in left leg: Secondary | ICD-10-CM

## 2014-08-28 DIAGNOSIS — M79604 Pain in right leg: Secondary | ICD-10-CM

## 2014-08-28 DIAGNOSIS — M545 Low back pain: Secondary | ICD-10-CM

## 2014-08-30 ENCOUNTER — Encounter: Payer: Self-pay | Admitting: Cardiology

## 2014-09-05 ENCOUNTER — Ambulatory Visit
Admission: RE | Admit: 2014-09-05 | Discharge: 2014-09-05 | Disposition: A | Discharge status: Home / Self Care | Payer: PRIVATE HEALTH INSURANCE | Source: Ambulatory Visit | Attending: Pain Medicine | Admitting: Pain Medicine

## 2014-09-05 ENCOUNTER — Encounter: Payer: Self-pay | Admitting: Neurology

## 2014-09-05 DIAGNOSIS — M79604 Pain in right leg: Secondary | ICD-10-CM

## 2014-09-05 DIAGNOSIS — M79605 Pain in left leg: Secondary | ICD-10-CM

## 2014-09-05 DIAGNOSIS — M545 Low back pain: Secondary | ICD-10-CM

## 2014-09-14 ENCOUNTER — Encounter: Payer: Self-pay | Admitting: Neurology

## 2014-12-28 DIAGNOSIS — G250 Essential tremor: Secondary | ICD-10-CM | POA: Insufficient documentation

## 2015-02-08 ENCOUNTER — Other Ambulatory Visit: Payer: Self-pay | Admitting: Anesthesiology

## 2015-02-08 ENCOUNTER — Ambulatory Visit
Admission: RE | Admit: 2015-02-08 | Discharge: 2015-02-08 | Disposition: A | Discharge status: Home / Self Care | Payer: PRIVATE HEALTH INSURANCE | Source: Ambulatory Visit | Attending: Anesthesiology | Admitting: Anesthesiology

## 2015-02-08 DIAGNOSIS — M542 Cervicalgia: Secondary | ICD-10-CM

## 2016-08-07 ENCOUNTER — Emergency Department (HOSPITAL_COMMUNITY)
Admission: EM | Admit: 2016-08-07 | Discharge: 2016-08-07 | Disposition: A | Discharge status: Home / Self Care | Payer: PRIVATE HEALTH INSURANCE | Attending: Emergency Medicine | Admitting: Emergency Medicine

## 2016-08-07 ENCOUNTER — Encounter (HOSPITAL_COMMUNITY): Payer: Self-pay | Admitting: *Deleted

## 2016-08-07 DIAGNOSIS — Z5321 Procedure and treatment not carried out due to patient leaving prior to being seen by health care provider: Secondary | ICD-10-CM | POA: Insufficient documentation

## 2016-08-07 DIAGNOSIS — R109 Unspecified abdominal pain: Secondary | ICD-10-CM | POA: Diagnosis not present

## 2016-08-07 LAB — CBC
HCT: 42.2 % (ref 39.0–52.0)
Hemoglobin: 14.8 g/dL (ref 13.0–17.0)
MCH: 31 pg (ref 26.0–34.0)
MCHC: 35.1 g/dL (ref 30.0–36.0)
MCV: 88.3 fL (ref 78.0–100.0)
Platelets: 222 10*3/uL (ref 150–400)
RBC: 4.78 MIL/uL (ref 4.22–5.81)
RDW: 12.1 % (ref 11.5–15.5)
WBC: 9.6 10*3/uL (ref 4.0–10.5)

## 2016-08-07 LAB — COMPREHENSIVE METABOLIC PANEL
ALBUMIN: 4.1 g/dL (ref 3.5–5.0)
ALK PHOS: 55 U/L (ref 38–126)
ALT: 15 U/L — AB (ref 17–63)
AST: 16 U/L (ref 15–41)
Anion gap: 9 (ref 5–15)
BILIRUBIN TOTAL: 0.9 mg/dL (ref 0.3–1.2)
BUN: 12 mg/dL (ref 6–20)
CO2: 27 mmol/L (ref 22–32)
Calcium: 9.1 mg/dL (ref 8.9–10.3)
Chloride: 102 mmol/L (ref 101–111)
Creatinine, Ser: 1.05 mg/dL (ref 0.61–1.24)
GFR calc Af Amer: >60 mL/min (ref >60)
GFR calc non Af Amer: >60 mL/min (ref >60)
Glucose, Bld: 92 mg/dL (ref 65–99)
Potassium: 3.9 mmol/L (ref 3.5–5.1)
Sodium: 138 mmol/L (ref 135–145)
TOTAL PROTEIN: 6.8 g/dL (ref 6.5–8.1)

## 2016-08-07 LAB — LIPASE, BLOOD: Lipase: 21 U/L (ref 11–51)

## 2016-08-07 NOTE — ED Triage Notes (Signed)
p reports mid abdominal pain for 1 week. Pt was seen and evaluated for same at [facility] with ct scans done as well. Pt states that he is not able to eat or drink due to pain. Denies N/V/D

## 2016-08-07 NOTE — ED Notes (Signed)
Pt states does not want to wait any longer. Seen leaving ED with steady gait

## 2016-09-04 ENCOUNTER — Other Ambulatory Visit: Payer: Self-pay | Admitting: Anesthesiology

## 2016-09-04 ENCOUNTER — Ambulatory Visit
Admission: RE | Admit: 2016-09-04 | Discharge: 2016-09-04 | Disposition: A | Discharge status: Home / Self Care | Payer: PRIVATE HEALTH INSURANCE | Source: Ambulatory Visit | Attending: Anesthesiology | Admitting: Anesthesiology

## 2016-09-04 DIAGNOSIS — M25562 Pain in left knee: Secondary | ICD-10-CM

## 2016-12-03 ENCOUNTER — Other Ambulatory Visit: Payer: Self-pay | Admitting: Gastroenterology

## 2016-12-03 DIAGNOSIS — R1033 Periumbilical pain: Secondary | ICD-10-CM

## 2016-12-10 ENCOUNTER — Ambulatory Visit (HOSPITAL_COMMUNITY)
Admission: RE | Admit: 2016-12-10 | Discharge: 2016-12-10 | Disposition: A | Discharge status: Home / Self Care | Payer: 59 | Source: Ambulatory Visit | Attending: Gastroenterology | Admitting: Gastroenterology

## 2016-12-10 DIAGNOSIS — R1033 Periumbilical pain: Secondary | ICD-10-CM | POA: Insufficient documentation

## 2016-12-10 MED ORDER — TECHNETIUM TC 99M MEBROFENIN IV KIT
5.0000 | PACK | Freq: Once | INTRAVENOUS | Status: AC
  Administered 2016-12-10: 5 via INTRAVENOUS

## 2017-03-15 ENCOUNTER — Emergency Department (HOSPITAL_COMMUNITY)
Admission: EM | Admit: 2017-03-15 | Discharge: 2017-03-15 | Disposition: A | Discharge status: Home / Self Care | Payer: 59 | Attending: Emergency Medicine | Admitting: Emergency Medicine

## 2017-03-15 ENCOUNTER — Encounter (HOSPITAL_COMMUNITY): Payer: Self-pay | Admitting: Nurse Practitioner

## 2017-03-15 ENCOUNTER — Emergency Department (HOSPITAL_COMMUNITY): Payer: 59

## 2017-03-15 DIAGNOSIS — Z87891 Personal history of nicotine dependence: Secondary | ICD-10-CM | POA: Diagnosis not present

## 2017-03-15 DIAGNOSIS — Z79899 Other long term (current) drug therapy: Secondary | ICD-10-CM | POA: Diagnosis not present

## 2017-03-15 DIAGNOSIS — R1084 Generalized abdominal pain: Secondary | ICD-10-CM | POA: Diagnosis present

## 2017-03-15 DIAGNOSIS — K429 Umbilical hernia without obstruction or gangrene: Secondary | ICD-10-CM | POA: Insufficient documentation

## 2017-03-15 LAB — CBC
HCT: 39.1 % (ref 39.0–52.0)
Hemoglobin: 13.6 g/dL (ref 13.0–17.0)
MCH: 30.9 pg (ref 26.0–34.0)
MCHC: 34.8 g/dL (ref 30.0–36.0)
MCV: 88.9 fL (ref 78.0–100.0)
Platelets: 197 10*3/uL (ref 150–400)
RBC: 4.4 MIL/uL (ref 4.22–5.81)
RDW: 12 % (ref 11.5–15.5)
WBC: 5 10*3/uL (ref 4.0–10.5)

## 2017-03-15 LAB — URINALYSIS, ROUTINE W REFLEX MICROSCOPIC
Bilirubin Urine: NEGATIVE
Glucose, UA: NEGATIVE mg/dL
Hgb urine dipstick: NEGATIVE
Ketones, ur: NEGATIVE mg/dL
LEUKOCYTES UA: NEGATIVE
Nitrite: NEGATIVE
PROTEIN: NEGATIVE mg/dL
Specific Gravity, Urine: 1.025 (ref 1.005–1.030)
pH: 5.5 (ref 5.0–8.0)

## 2017-03-15 LAB — COMPREHENSIVE METABOLIC PANEL
ALK PHOS: 46 U/L (ref 38–126)
ALT: 16 U/L — AB (ref 17–63)
AST: 17 U/L (ref 15–41)
Albumin: 4.1 g/dL (ref 3.5–5.0)
Anion gap: 8 (ref 5–15)
BUN: 15 mg/dL (ref 6–20)
CALCIUM: 8.8 mg/dL — AB (ref 8.9–10.3)
CHLORIDE: 105 mmol/L (ref 101–111)
CO2: 26 mmol/L (ref 22–32)
Creatinine, Ser: 1.15 mg/dL (ref 0.61–1.24)
Glucose, Bld: 67 mg/dL (ref 65–99)
Potassium: 3.7 mmol/L (ref 3.5–5.1)
Sodium: 139 mmol/L (ref 135–145)
Total Bilirubin: 0.5 mg/dL (ref 0.3–1.2)
Total Protein: 6.3 g/dL — ABNORMAL LOW (ref 6.5–8.1)

## 2017-03-15 LAB — DIFFERENTIAL
Basophils Absolute: 0 10*3/uL (ref 0.0–0.1)
Basophils Relative: 0 %
EOS ABS: 0.1 10*3/uL (ref 0.0–0.7)
EOS PCT: 2 %
LYMPHS ABS: 1.9 10*3/uL (ref 0.7–4.0)
Lymphocytes Relative: 38 %
MONO ABS: 0.5 10*3/uL (ref 0.1–1.0)
MONOS PCT: 10 %
Neutro Abs: 2.4 10*3/uL (ref 1.7–7.7)
Neutrophils Relative %: 50 %

## 2017-03-15 LAB — LIPASE, BLOOD: Lipase: 21 U/L (ref 11–51)

## 2017-03-15 MED ORDER — ONDANSETRON HCL 4 MG/2ML IJ SOLN
4.0000 mg | Freq: Once | INTRAMUSCULAR | Status: AC
  Administered 2017-03-15: 4 mg via INTRAVENOUS
  Filled 2017-03-15: qty 2

## 2017-03-15 MED ORDER — HYDROMORPHONE HCL 1 MG/ML IJ SOLN
0.5000 mg | Freq: Once | INTRAMUSCULAR | Status: AC
  Administered 2017-03-15: 0.5 mg via INTRAVENOUS
  Filled 2017-03-15: qty 1

## 2017-03-15 MED ORDER — IOPAMIDOL (ISOVUE-300) INJECTION 61%
INTRAVENOUS | Status: AC
  Administered 2017-03-15: 100 mL
  Filled 2017-03-15: qty 100

## 2017-03-15 MED ORDER — SODIUM CHLORIDE 0.9 % IV BOLUS (SEPSIS)
1000.0000 mL | Freq: Once | INTRAVENOUS | Status: AC
  Administered 2017-03-15: 1000 mL via INTRAVENOUS

## 2017-03-15 NOTE — ED Notes (Signed)
ED Provider at bedside. 

## 2017-03-15 NOTE — ED Triage Notes (Addendum)
Pt presents with c/o abdominal pain. The pain began while walking around a store this afternoon. The pain is a constant stabbing pain around his umbilicus. He denies fevers, chills, nausea, vomiting, dysuria, urinary frequency, constipation, diarrhea. He tried tylenol with no relief of the pain.

## 2017-03-15 NOTE — Discharge Instructions (Signed)
You have a fat containing hernia at the area of the belly button that is causing your pain. There is nothing acute to do about this, but if it get worse and if you have bowel that is getting stuck in the hernia you will need to see a surgeon. Please follow-up with your primary care doctor.  Return without fail for worsening symptoms, including fever, escalating pain, intractable vomiting or any other symptoms concerning to you.

## 2017-03-15 NOTE — ED Provider Notes (Signed)
[facility] DEPT Provider Note   CSN: [id number] Arrival date & time: 03/15/17  1749     History   Chief Complaint Chief Complaint  Patient presents with  . Abdominal Pain    HPI Douglas [name] is a 41 y.o. male.  The history is provided by the patient.  Abdominal Pain   This is a new problem. The current episode started 1 to 2 hours ago. The problem occurs constantly. The problem has not changed since onset.The pain is associated with an unknown factor. The pain is located in the periumbilical region. The pain is severe. Pertinent negatives include fever, diarrhea, hematochezia, melena, nausea, vomiting, dysuria, frequency and hematuria. The symptoms are aggravated by palpation. Nothing relieves the symptoms. Past workup includes GI consult.   41 year old male who presents with abdominal pain. He has a history of bilateral inguinal hernia repairs. States that he has followed with Dr. Almyra Free from GI for intermittent upper abdominal pain that has been ongoing for the past few months. Has been treated for GERD, and is also had a normal HIDA scan for gallbladder function. States that today while walking in a store he had sudden onset of sharp stabbing. Umbilical abdominal pain that has been constant. States that this is a different pain than what he has felt before in the past. It is not associated with food or eating. Denies any fevers, chills, nausea or vomiting, diarrhea, constipation, dysuria, hematuria, testicular swelling or pain, chest pain or difficulty breathing. Past Medical History:  Diagnosis Date  . Arthritis   . GERD (gastroesophageal reflux disease)    occasional  . Headache(784.0)    cluster headache  . Neuromuscular disorder (HCC)    tremors  . Sleep apnea    cpap setting of 6    Patient Active Problem List   Diagnosis Date Noted  . 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 Post-op pain 04/29/2013  . Bilateral inguinal hernia 04/04/2013    Past Surgical History:    Procedure Laterality Date  . CARPAL TUNNEL RELEASE Bilateral  june left, right july 2013  . HERNIA REPAIR  2011  . INGUINAL HERNIA REPAIR N/A 03/29/2013   Procedure: LAPAROSCOPIC BILATERAL INGUINAL HERNIA REPAIR;  Surgeon: Joyice Faster. Cornett, MD;  Location: [facility];  Service: General;  Laterality: N/A;  . INSERTION OF MESH N/A 03/29/2013   Procedure: INSERTION OF MESH;  Surgeon: Joyice Faster. Cornett, MD;  Location: [facility];  Service: General;  Laterality: N/A;  . INTERCOSTAL NERVE BLOCK  2015       Home Medications    Prior to Admission medications   Medication Sig Start Date End Date Taking? Authorizing Provider  divalproex (DEPAKOTE ER) 500 MG 24 hr tablet Take 1 tablet (500 mg total) by mouth daily. 08/09/14   Melvenia Beam, MD  oxyCODONE-acetaminophen (PERCOCET/ROXICET) 5-325 MG per tablet Take 1 tablet by mouth as needed. 07/14/14   [provider]  primidone (MYSOLINE) 50 MG tablet Take 50 mg by mouth at bedtime.     [provider]  propranolol ER (INDERAL LA) 120 MG 24 hr capsule Take 120 mg by mouth every morning.    [provider]    Family History Family History  Problem Relation Age of Onset  . Tremor Brother   . Brain cancer Maternal Grandfather     Social History Social History  Substance Use Topics  . Smoking status: Former Smoker    Packs/day: 1.00    Years: 8.00    Types: Cigarettes  Quit date: 02/07/1994  . Smokeless tobacco: Never Used  . Alcohol use 0.0 oz/week     Comment: occasional 1-2 drinks/ month     Allergies   Sulfa drugs cross reactors   Review of Systems Review of Systems  Constitutional: Negative for fever.  Respiratory: Negative for shortness of breath.   Cardiovascular: Negative for chest pain.  Gastrointestinal: Positive for abdominal pain. Negative for diarrhea, hematochezia, melena, nausea and vomiting.  Genitourinary: Negative for dysuria, frequency and hematuria.  All other systems reviewed and are  negative.    Physical Exam Updated Vital Signs BP 121/70 (BP Location: Left Arm)   Pulse (!) 52   Temp 98.1 F (36.7 C) (Oral)   Resp 12   SpO2 97%   Physical Exam Physical Exam  Nursing note and vitals reviewed. Constitutional: Well developed, well nourished, non-toxic, and in no acute distress Head: Normocephalic and atraumatic.  Mouth/Throat: Oropharynx is clear and moist.  Neck: Normal range of motion. Neck supple.  Cardiovascular: Normal rate and regular rhythm.   Pulmonary/Chest: Effort normal and breath sounds normal.  Abdominal: Soft. There is epigastric and periumbilical tenderness. Mild tenderness in the right lower quadrant. No Murphy's sign. No CVA tenderness There is no rebound and no guarding.  Musculoskeletal: Normal range of motion.  Neurological: Alert, no facial droop, fluent speech, moves all extremities symmetrically Skin: Skin is warm and dry.  Psychiatric: Cooperative   ED Treatments / Results  Labs (all labs ordered are listed, but only abnormal results are displayed) Labs Reviewed  COMPREHENSIVE METABOLIC PANEL - Abnormal; Notable for the following:       Result Value   Calcium 8.8 (*)    Total Protein 6.3 (*)    ALT 16 (*)    All other components within normal limits  CBC  URINALYSIS, ROUTINE W REFLEX MICROSCOPIC  DIFFERENTIAL  LIPASE, BLOOD    EKG  EKG Interpretation None       Radiology Ct Abdomen Pelvis W Contrast  Result Date: 03/15/2017 CLINICAL DATA:  Umbilical pain. History of bilateral inguinal hernia repair with mesh. EXAM: CT ABDOMEN AND PELVIS WITH CONTRAST TECHNIQUE: Multidetector CT imaging of the abdomen and pelvis was performed using the standard protocol following bolus administration of intravenous contrast. CONTRAST:  121mL ISOVUE-300 IOPAMIDOL (ISOVUE-300) INJECTION 61% COMPARISON:  None. FINDINGS: Lower chest: Cardiac motion related artifacts are believed to account for the linear hypodensities within the SVC and  ascending aorta simulating dissection. If dissection is of clinical concern, a repeat CTA of the chest could be performed with cardiac gating. Bibasilar dependent atelectasis is noted. Hepatobiliary: Hypodensity along the falciform ligament believed to represent fatty infiltration of the liver. No biliary dilatation. Normal gallbladder. Pancreas: Normal Spleen: Normal Adrenals/Urinary Tract: Normal bilateral adrenal glands. No obstructive uropathy or enhancing renal mass. The urinary bladder is physiologically distended. Stomach/Bowel: Small fat containing umbilical hernia is noted, new since previous exam. The stomach is physiologically distended with ingested food. No small bowel dilatation or inflammation. A moderate volume of fecal residue is seen within large bowel. Normal-appearing appendix. No bowel obstruction. Vascular/Lymphatic: No significant vascular findings within the abdomen or pelvis. No enlarged abdominal or pelvic lymph nodes. Reproductive: Central zone calcifications of prostate gland. Normal size prostate with unremarkable appearance of the seminal vesicles. Other: No free air free fluid. Bilateral inguinal hernia repair without apparent recurrence. 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 No acute nor suspicious osseous abnormalities. IMPRESSION: 1. New small fat containing umbilical hernia. 2. Bilateral inguinal  hernia repair without recurrence. 3. Increased colonic stool burden. 4. No acute bowel obstruction or inflammation. 5. Cardiac motion related artifacts believed to account for linear hypodensities within the SVC and ascending aorta. Dissection is believed less likely. If this is of clinical concern however, repeat CT imaging of the chest with contrast and cardiac gating may prove useful. Electronically Signed   By: Ashley Royalty M.D.   On: 03/15/2017 20:35    Procedures Procedures (including critical care time)  Medications Ordered in ED Medications  sodium  chloride 0.9 % bolus 1,000 mL (0 mLs Intravenous Stopped 03/15/17 2040)  ondansetron (ZOFRAN) injection 4 mg (4 mg Intravenous Given 03/15/17 1937)  HYDROmorphone (DILAUDID) injection 0.5 mg (0.5 mg Intravenous Given 03/15/17 1937)  iopamidol (ISOVUE-300) 61 % injection (100 mLs  Contrast Given 03/15/17 1956)     Initial Impression / Assessment and Plan / ED Course  I have reviewed the triage vital signs and the nursing notes.  Pertinent labs & imaging results that were available during my care of the patient were reviewed by me and considered in my medical decision making (see chart for details).     41 year old male who presents with sudden onset of periumbilical abdominal pain. He is well-appearing in no acute distress with normal vital signs. His abdomen is soft and nonsurgical. Tenderness primarily in the umbilical region. Differential does include potential early appendicitis. He did undergo CT abdomen and pelvis, which is visualized and reviewed with radiology. He has evidence of a new fat-containing umbilical hernia. This is likely causing his sudden pain. Symptoms have been better controlled here in the ED with supportive management. Blood work overall is reassuring. CT also incidentally question of there may be some linear densities/artifact with ascending aorta, but felt unlikely to be dissection. Clinically not having any symptoms that would be concerning for ascending dissection. He will follow-up with PCP. Strict return and follow-up instructions reviewed. She expressed understanding of all discharge instructions and felt comfortable with the plan of care.   Final Clinical Impressions(s) / ED Diagnoses   Final diagnoses:  Umbilical hernia without obstruction and without gangrene    New Prescriptions New Prescriptions   No medications on file     Forde Dandy, MD 03/15/17 2048

## 2017-03-15 NOTE — ED Notes (Signed)
Patient transported to CT 

## 2017-03-15 NOTE — ED Notes (Signed)
Pt departed in NAD, refused use of wheelchair.  

## 2017-03-30 ENCOUNTER — Ambulatory Visit: Payer: Self-pay | Admitting: Surgery

## 2017-03-30 NOTE — H&P (Signed)
Douglas [name] 03/30/2017 2:41 PM Location: [facility] Patient #: [id number] DOB: 01/22/1976 Married / Language: English / Race: White Male  History of Present Illness (Douglas [name] Douglas [name] Alton MD; 03/30/2017 3:26 PM) Patient words: Patient presents for follow-up of abdominal pain. He was seen in the emergency room last week and for periumbilical abdominal pain. He's had a history of chronic abdominal pain the last few years. Most of it surrounding his umbilicus but sometimes he has issues with his upper stomach he states. He did have an endoscopy which showed some linear ulcers back in March this was treated with medications. He complains of getting abdominal pain after eating fatty greasy meals. He also has abdominal pain after dairy products. The patient the pain is usually around his umbilicus or his epigastrium. Sharp in nature. Its intermittent and comes and goes but it somewhat unpredictable he thinks. Abdominal CT scan was done the emergency room which showed a questionable very small umbilical hernia. He has never had an ultrasound. HIDA study was done earlier this year which showed an ejection fraction of 43% no pain with insured administration. His gallbladder did fill. He has been to multiple GI groups and unfortunately still has pain in his stomach. His groin pain is better he is followed by the pain clinic here in [place]. He's had a history of an open left inguinal hernia repair back in 2011 followed by bilateral laparoscopic repairs secondary to recurrence the left inguinal hernia. He denies groin pain today.                         CLINICAL DATA: Umbilical pain. History of bilateral inguinal hernia repair with mesh.  EXAM: CT ABDOMEN AND PELVIS WITH CONTRAST  TECHNIQUE: Multidetector CT imaging of the abdomen and pelvis was performed using the standard protocol following bolus administration of intravenous contrast.  CONTRAST:  100mL ISOVUE-300 IOPAMIDOL (ISOVUE-300) INJECTION 61%  COMPARISON: None.  FINDINGS: Lower chest: Cardiac motion related artifacts are believed to account for the linear hypodensities within the SVC and ascending aorta simulating dissection. If dissection is of clinical concern, a repeat CTA of the chest could be performed with cardiac gating. Bibasilar dependent atelectasis is noted.  Hepatobiliary: Hypodensity along the falciform ligament believed to represent fatty infiltration of the liver. No biliary dilatation. Normal gallbladder.  Pancreas: Normal  Spleen: Normal  Adrenals/Urinary Tract: Normal bilateral adrenal glands. No obstructive uropathy or enhancing renal mass. The urinary bladder is physiologically distended.  Stomach/Bowel: Small fat containing umbilical hernia is noted, new since previous exam. The stomach is physiologically distended with ingested food. No small bowel dilatation or inflammation. A moderate volume of fecal residue is seen within large bowel. Normal-appearing appendix. No bowel obstruction.  Vascular/Lymphatic: No significant vascular findings within the abdomen or pelvis. No enlarged abdominal or pelvic lymph nodes.  Reproductive: Central zone calcifications of prostate gland. Normal size prostate with unremarkable appearance of the seminal vesicles.  Other: No free air free fluid. Bilateral inguinal hernia repair without apparent recurrence.  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 No acute nor suspicious osseous abnormalities.  IMPRESSION: 1. New small fat containing umbilical hernia. 2. Bilateral inguinal hernia repair without recurrence. 3. Increased colonic stool burden. 4. No acute bowel obstruction or inflammation. 5. Cardiac motion related artifacts believed to account for linear hypodensities within the SVC and ascending aorta. Dissection is believed less likely. If this is of clinical concern however,  repeat CT imaging of the chest   with contrast and cardiac gating may prove useful.   Electronically Signed By: Douglas [name] M.D. On: 03/15/2017 20:35.  The patient is a 41 year old male.   Past Surgical History (Douglas [name], RMA; 03/30/2017 2:41 PM) Laparoscopic Inguinal Hernia Surgery Right. Open Inguinal Hernia Surgery Left.  Diagnostic Studies History (Douglas [name], RMA; 03/30/2017 2:41 PM) Colonoscopy 1-5 years ago  Allergies (Douglas [name], RMA; 03/30/2017 2:42 PM) Sulfa Antibiotics Allergies Reconciled  Medication History (Douglas [name], RMA; 03/30/2017 2:43 PM) Propranolol HCl ER (120MG Capsule ER 24HR, Oral) Active. Pantoprazole Sodium (40MG Tablet DR, Oral) Active. Hydrocodone-Acetaminophen (5-325MG Tablet, Oral) Active. Chlorzoxazone (500MG Tablet, Oral) Active. Medications Reconciled  Social History (Douglas [name], RMA; 03/30/2017 2:41 PM) Alcohol use Occasional alcohol use. Caffeine use Tea. Illicit drug use Remotely quit drug use. Tobacco use Former smoker.  Family History (Douglas [name], RMA; 03/30/2017 2:41 PM) Arthritis Father, Mother. Diabetes Mellitus Father, Mother. Hypertension Father.  Other Problems (Douglas [name], RMA; 03/30/2017 2:41 PM) Back Pain Inguinal Hernia Sleep Apnea     Review of Systems (Douglas [name] RMA; 03/30/2017 2:41 PM) General Present- Weight Loss. Not Present- Appetite Loss, Chills, Fatigue, Fever, Night Sweats and Weight Gain. Skin Not Present- Change in Wart/Mole, Dryness, Hives, Jaundice, New Lesions, Non-Healing Wounds, Rash and Ulcer. HEENT Not Present- Earache, Hearing Loss, Hoarseness, Nose Bleed, Oral Ulcers, Ringing in the Ears, Seasonal Allergies, Sinus Pain, Sore Throat, Visual Disturbances, Wears glasses/contact lenses and Yellow Eyes. Respiratory Not Present- Bloody sputum, Chronic Cough, Difficulty Breathing, Snoring and Wheezing. Breast Not Present- Breast Mass, Breast  Pain, Nipple Discharge and Skin Changes. Cardiovascular Not Present- Chest Pain, Difficulty Breathing Lying Down, Leg Cramps, Palpitations, Rapid Heart Rate, Shortness of Breath and Swelling of Extremities. Gastrointestinal Present- Abdominal Pain. Not Present- Bloating, Bloody Stool, Change in Bowel Habits, Chronic diarrhea, Constipation, Difficulty Swallowing, Excessive gas, Gets full quickly at meals, Hemorrhoids, Indigestion, Nausea, Rectal Pain and Vomiting. Male Genitourinary Not Present- Blood in Urine, Change in Urinary Stream, Frequency, Impotence, Nocturia, Painful Urination, Urgency and Urine Leakage. Musculoskeletal Present- Back Pain. Not Present- Joint Pain, Joint Stiffness, Muscle Pain, Muscle Weakness and Swelling of Extremities. Neurological Present- Tremor. Not Present- Decreased Memory, Fainting, Headaches, Numbness, Seizures, Tingling, Trouble walking and Weakness. Psychiatric Not Present- Anxiety, Bipolar, Change in Sleep Pattern, Depression, Fearful and Frequent crying. Endocrine Not Present- Cold Intolerance, Excessive Hunger, Hair Changes, Heat Intolerance, Hot flashes and New Diabetes. Hematology Not Present- Blood Thinners, Easy Bruising, Excessive bleeding, Gland problems, HIV and Persistent Infections.  Vitals (Douglas [name] RMA; 03/30/2017 2:42 PM) 03/30/2017 2:41 PM Weight: 214.6 lb Height: 74in Body Surface Area: 2.24 m Body Mass Index: 27.55 kg/m  Temp.: 98.2F  Pulse: 54 (Regular)  P.OX: 98% (Room air) BP: 122/78 (Sitting, Left Arm, Standard)      Physical Exam (Elena Cothern A. Matai Carpenito MD; 03/30/2017 3:27 PM)  General Mental Status-Alert. General Appearance-Consistent with stated age. Hydration-Well hydrated. Voice-Normal.  Head and Neck Head-normocephalic, atraumatic with no lesions or palpable masses.  Eye Eyeball - Bilateral-Extraocular movements intact. Sclera/Conjunctiva - Bilateral-No scleral icterus.  Abdomen Note:  Soft with minimal periumbilical tenderness. No palpable umbilical hernia I can feel. No rebound or guarding. Negative Murphy sign. Mild epigastric discomfort to palpation. Scars from laparoscopic hernia repair noted.  Neurologic Neurologic evaluation reveals -alert and oriented x 3 with no impairment of recent or remote memory. Mental Status-Normal.  Musculoskeletal Normal Exam - Left-Upper Extremity Strength Normal and Lower Extremity Strength Normal. Normal Exam - Right-Upper   Extremity Strength Normal, Lower Extremity Weakness.    Assessment & Plan (Sharmeka Palmisano A. Spenser Harren MD; 03/30/2017 3:28 PM)  ABDOMINAL PAIN (R10.9) Impression: Discussed options with the patient today. He does have symptoms of biliary colic even though his HIDA scan was read as normal. His ejection fraction was 43%. He has a history of greasy fatty food intolerance as well as dairy product intolerance causing nausea, vomiting and abdominal pain. CT scan showed a questionable mobile hernia. It's unclear to me if this is just his umbilicus are very small umbilical hernia. I discussed options of observation. He's been under maximal medical therapy and workup has included endoscopy which showed some linear ulcers back in March. These have been treated. I've offered him cholecystectomy at this point since his workup was otherwise negative and he does have some signs of colic-like symptoms after eating fatty greasy meals. The area at his umbilicus can be evaluated during surgery better if he doesn't a small umbilical hernia this will be closed as part of the laparoscopic cholecystectomy since this is where the umbilical port site would go. He will talk with his wife but seems eager to proceed with cholecystectomy after our discussion today. The procedure has been discussed with the patient. Risks of laparoscopic cholecystectomy include bleeding, infection, bile duct injury, leak, death, open surgery, diarrhea, other surgery, organ  injury, blood vessel injury, DVT, and additional care.  Success rates for surgery are 25 - 75% for his problem. He understands and agrees to proceed once he talks with his wife.  Current Plans You are being scheduled for surgery- Our schedulers will call you.  You should hear from our office's scheduling department within 5 working days about the location, date, and time of surgery. We try to make accommodations for patient's preferences in scheduling surgery, but sometimes the OR schedule or the surgeon's schedule prevents us from making those accommodations.  If you have not heard from our office ([phone]) in 5 working days, call the office and ask for your surgeon's nurse.  If you have other questions about your diagnosis, plan, or surgery, call the office and ask for your surgeon's nurse.  Pt Education - Pamphlet Given - Laparoscopic Gallbladder Surgery: discussed with patient and provided information. The anatomy & physiology of hepatobiliary & pancreatic function was discussed. The pathophysiology of gallbladder dysfunction was discussed. Natural history risks without surgery was discussed. I feel the risks of no intervention will lead to serious problems that outweigh the operative risks; therefore, I recommended cholecystectomy to remove the pathology. I explained laparoscopic techniques with possible need for an open approach. Probable cholangiogram to evaluate the bilary tract was explained as well.  Risks such as bleeding, infection, abscess, leak, injury to other organs, need for further treatment, heart attack, death, and other risks were discussed. I noted a good likelihood this will help address the problem. Possibility that this will not correct all abdominal symptoms was explained. Goals of post-operative recovery were discussed as well. We will work to minimize complications. An educational handout further explaining the pathology and treatment options was given  as well. Questions were answered. The patient expresses understanding & wishes to proceed with surgery.  Pt Education - [facility] Laparosopic Post Op HCI PERIUMBILICAL ABDOMINAL PAIN (R10.33)  PAIN OF UPPER ABDOMEN (R10.10)  UMBILICAL HERNIA WITHOUT OBSTRUCTION AND WITHOUT GANGRENE (K42.9) 

## 2017-03-31 ENCOUNTER — Other Ambulatory Visit: Payer: Self-pay | Admitting: Surgery

## 2017-03-31 DIAGNOSIS — R1084 Generalized abdominal pain: Secondary | ICD-10-CM

## 2017-04-03 ENCOUNTER — Ambulatory Visit
Admission: RE | Admit: 2017-04-03 | Discharge: 2017-04-03 | Disposition: A | Discharge status: Home / Self Care | Payer: 59 | Source: Ambulatory Visit | Attending: Surgery | Admitting: Surgery

## 2017-04-03 DIAGNOSIS — R1084 Generalized abdominal pain: Secondary | ICD-10-CM

## 2017-04-06 ENCOUNTER — Inpatient Hospital Stay (HOSPITAL_COMMUNITY): Admission: RE | Admit: 2017-04-06 | Payer: 59 | Source: Ambulatory Visit

## 2017-04-06 NOTE — Pre-Procedure Instructions (Signed)
Douglas [name]  04/06/2017      [facility], [place], [place] - [address] [place] [address] Phone: [phone] Fax: [place], [place] [facility] [address] [place] [address] [place] [address] Phone: [phone] Fax: [phone]    Your procedure is scheduled on Thursday August 30.  Report to [facility] Admitting at 5:30 A.M.  Call this number if you have problems the morning of surgery:  [phone]   Remember:  Do not eat food or drink liquids after midnight.  ** DRINK 8oz BOOST BREEZE Drink 2 hours prior to arrival time to the hospital (at 3:30 AM)   Take these medicines the morning of surgery with A SIP OF WATER: propranolol (Inderal), pantoprazole (protonix), chlorzoxazone (Parafon), hydrocodone (NORCO) OR tylenol for pain  7 days prior to surgery STOP taking any Aspirin, Aleve, Naproxen, Ibuprofen, Motrin, Advil, Goody's, BC's, all herbal medications, fish oil, and all vitamins    Do not wear jewelry, make-up or nail polish.  Do not wear lotions, powders, or perfumes, or deoderant.  Do not shave 48 hours prior to surgery.  Men may shave face and neck.  Do not bring valuables to the hospital.  [facility] is not responsible for any belongings or valuables.  Contacts, dentures or bridgework may not be worn into surgery.  Leave your suitcase in the car.  After surgery it may be brought to your room.  For patients admitted to the hospital, discharge time will be determined by your treatment team.  Patients discharged the day of surgery will not be allowed to drive home.    Special instructions:    [facility]- Preparing For Surgery  Before surgery, you can play an important role. Because skin is not sterile, your skin needs to be as free of germs as possible. You can reduce the number of germs on your skin by washing with CHG (chlorahexidine  gluconate) Soap before surgery.  CHG is an antiseptic cleaner which kills germs and bonds with the skin to continue killing germs even after washing.  Please do not use if you have an allergy to CHG or antibacterial soaps. If your skin becomes reddened/irritated stop using the CHG.  Do not shave (including legs and underarms) for at least 48 hours prior to first CHG shower. It is OK to shave your face.  Please follow these instructions carefully.   1. Shower the NIGHT BEFORE SURGERY and the MORNING OF SURGERY with CHG.   2. If you chose to wash your hair, wash your hair first as usual with your normal shampoo.  3. After you shampoo, rinse your hair and body thoroughly to remove the shampoo.  4. Use CHG as you would any other liquid soap. You can apply CHG directly to the skin and wash gently with a scrungie or a clean washcloth.   5. Apply the CHG Soap to your body ONLY FROM THE NECK DOWN.  Do not use on open wounds or open sores. Avoid contact with your eyes, ears, mouth and genitals (private parts). Wash genitals (private parts) with your normal soap.  6. Wash thoroughly, paying special attention to the area where your surgery will be performed.  7. Thoroughly rinse your body with warm water from the neck down.  8. DO NOT shower/wash with your normal soap after using and rinsing off the CHG Soap.  9. Pat yourself dry with a CLEAN TOWEL.   10. Wear CLEAN PAJAMAS   11. Place CLEAN SHEETS on your bed the night of your first shower and DO NOT SLEEP WITH PETS.    Day of Surgery: Do not apply any deodorants/lotions. Please wear clean clothes to the hospital/surgery center.

## 2017-04-07 ENCOUNTER — Encounter (HOSPITAL_COMMUNITY): Payer: Self-pay

## 2017-04-07 ENCOUNTER — Encounter (HOSPITAL_COMMUNITY)
Admission: RE | Admit: 2017-04-07 | Discharge: 2017-04-07 | Disposition: A | Discharge status: Home / Self Care | Payer: 59 | Source: Ambulatory Visit | Attending: Surgery | Admitting: Surgery

## 2017-04-07 DIAGNOSIS — K811 Chronic cholecystitis: Secondary | ICD-10-CM | POA: Diagnosis not present

## 2017-04-07 DIAGNOSIS — G4730 Sleep apnea, unspecified: Secondary | ICD-10-CM | POA: Diagnosis not present

## 2017-04-07 DIAGNOSIS — K828 Other specified diseases of gallbladder: Secondary | ICD-10-CM | POA: Diagnosis not present

## 2017-04-07 LAB — COMPREHENSIVE METABOLIC PANEL
ALBUMIN: 3.9 g/dL (ref 3.5–5.0)
ALT: 15 U/L — ABNORMAL LOW (ref 17–63)
ANION GAP: 6 (ref 5–15)
AST: 15 U/L (ref 15–41)
Alkaline Phosphatase: 49 U/L (ref 38–126)
BUN: 14 mg/dL (ref 6–20)
CHLORIDE: 107 mmol/L (ref 101–111)
CO2: 27 mmol/L (ref 22–32)
Calcium: 9 mg/dL (ref 8.9–10.3)
Creatinine, Ser: 0.87 mg/dL (ref 0.61–1.24)
GFR calc Af Amer: >60 mL/min (ref >60)
GFR calc non Af Amer: >60 mL/min (ref >60)
Glucose, Bld: 95 mg/dL (ref 65–99)
POTASSIUM: 4.3 mmol/L (ref 3.5–5.1)
SODIUM: 140 mmol/L (ref 135–145)
TOTAL PROTEIN: 6.3 g/dL — AB (ref 6.5–8.1)
Total Bilirubin: 0.7 mg/dL (ref 0.3–1.2)

## 2017-04-07 LAB — CBC WITH DIFFERENTIAL/PLATELET
Basophils Absolute: 0 K/uL (ref 0.0–0.1)
Basophils Relative: 0 %
Eosinophils Absolute: 0.2 K/uL (ref 0.0–0.7)
Eosinophils Relative: 4 %
HCT: 40.3 % (ref 39.0–52.0)
Hemoglobin: 13.7 g/dL (ref 13.0–17.0)
Lymphocytes Relative: 29 %
Lymphs Abs: 1.7 K/uL (ref 0.7–4.0)
MCH: 30.6 pg (ref 26.0–34.0)
MCHC: 34 g/dL (ref 30.0–36.0)
MCV: 90.2 fL (ref 78.0–100.0)
Monocytes Absolute: 0.6 K/uL (ref 0.1–1.0)
Monocytes Relative: 10 %
Neutro Abs: 3.3 K/uL (ref 1.7–7.7)
Neutrophils Relative %: 57 %
Platelets: 179 K/uL (ref 150–400)
RBC: 4.47 MIL/uL (ref 4.22–5.81)
RDW: 12.3 % (ref 11.5–15.5)
WBC: 5.8 K/uL (ref 4.0–10.5)

## 2017-04-07 MED ORDER — CHLORHEXIDINE GLUCONATE CLOTH 2 % EX PADS: 6.0000 | MEDICATED_PAD | Freq: Once | CUTANEOUS | Status: DC

## 2017-04-07 NOTE — Progress Notes (Signed)
PCP - Anastasia Pall Dr. Isidoro Donning performed sleep study- records requested  Pt denies cardiac history, takes inderal for tremors.   Patient denies shortness of breath, fever, cough and chest pain at PAT appointment  Patient verbalized understanding of instructions that were given to them at the PAT appointment. Patient was also instructed that they will need to review over the PAT instructions again at home before surgery.

## 2017-04-08 MED ORDER — DEXTROSE 5 % IV SOLN
3.0000 g | INTRAVENOUS | Status: AC
  Administered 2017-04-09: 3 g via INTRAVENOUS
  Filled 2017-04-08: qty 3000

## 2017-04-09 ENCOUNTER — Ambulatory Visit (HOSPITAL_COMMUNITY)
Admission: RE | Admit: 2017-04-09 | Discharge: 2017-04-09 | Disposition: A | Discharge status: Home / Self Care | Payer: 59 | Source: Ambulatory Visit | Attending: Surgery | Admitting: Surgery

## 2017-04-09 ENCOUNTER — Ambulatory Visit (HOSPITAL_COMMUNITY): Payer: 59

## 2017-04-09 ENCOUNTER — Encounter (HOSPITAL_COMMUNITY): Payer: Self-pay | Admitting: *Deleted

## 2017-04-09 ENCOUNTER — Ambulatory Visit (HOSPITAL_COMMUNITY): Payer: 59 | Admitting: Certified Registered Nurse Anesthetist

## 2017-04-09 ENCOUNTER — Encounter (HOSPITAL_COMMUNITY)
Admission: RE | Disposition: A | Discharge status: Home / Self Care | Payer: Self-pay | Source: Ambulatory Visit | Attending: Surgery

## 2017-04-09 ENCOUNTER — Ambulatory Visit: Admit: 2017-04-09 | Payer: 59 | Admitting: Surgery

## 2017-04-09 DIAGNOSIS — K811 Chronic cholecystitis: Secondary | ICD-10-CM | POA: Diagnosis not present

## 2017-04-09 DIAGNOSIS — G4730 Sleep apnea, unspecified: Secondary | ICD-10-CM | POA: Insufficient documentation

## 2017-04-09 DIAGNOSIS — K828 Other specified diseases of gallbladder: Secondary | ICD-10-CM | POA: Insufficient documentation

## 2017-04-09 DIAGNOSIS — Z419 Encounter for procedure for purposes other than remedying health state, unspecified: Secondary | ICD-10-CM

## 2017-04-09 HISTORY — PX: CHOLECYSTECTOMY: SHX55

## 2017-04-09 SURGERY — LAPAROSCOPIC CHOLECYSTECTOMY SINGLE SITE WITH INTRAOPERATIVE CHOLANGIOGRAM
Anesthesia: General

## 2017-04-09 SURGERY — LAPAROSCOPIC CHOLECYSTECTOMY WITH INTRAOPERATIVE CHOLANGIOGRAM
Anesthesia: General | Site: Abdomen

## 2017-04-09 MED ORDER — HYDROMORPHONE HCL 1 MG/ML IJ SOLN
INTRAMUSCULAR | Status: AC
  Administered 2017-04-09: 0.5 mg via INTRAVENOUS
  Filled 2017-04-09: qty 1

## 2017-04-09 MED ORDER — PROPOFOL 10 MG/ML IV BOLUS
INTRAVENOUS | Status: AC
  Filled 2017-04-09: qty 20

## 2017-04-09 MED ORDER — FENTANYL CITRATE (PF) 250 MCG/5ML IJ SOLN
INTRAMUSCULAR | Status: AC
  Filled 2017-04-09: qty 5

## 2017-04-09 MED ORDER — OXYCODONE HCL 5 MG PO TABS
5.0000 mg | ORAL_TABLET | Freq: Once | ORAL | Status: AC
  Administered 2017-04-09: 5 mg via ORAL

## 2017-04-09 MED ORDER — EPHEDRINE SULFATE 50 MG/ML IJ SOLN
INTRAMUSCULAR | Status: DC | PRN
  Administered 2017-04-09: 5 mg via INTRAVENOUS

## 2017-04-09 MED ORDER — ONDANSETRON HCL 4 MG/2ML IJ SOLN
INTRAMUSCULAR | Status: DC | PRN
  Administered 2017-04-09: 4 mg via INTRAVENOUS

## 2017-04-09 MED ORDER — LIDOCAINE HCL (CARDIAC) 20 MG/ML IV SOLN
INTRAVENOUS | Status: DC | PRN
  Administered 2017-04-09: 40 mg via INTRATRACHEAL
  Administered 2017-04-09: 60 mg via INTRAVENOUS

## 2017-04-09 MED ORDER — SODIUM CHLORIDE 0.9 % IR SOLN
Status: DC | PRN
  Administered 2017-04-09: 1000 mL

## 2017-04-09 MED ORDER — KETOROLAC TROMETHAMINE 30 MG/ML IJ SOLN: 30.0000 mg | Freq: Once | INTRAMUSCULAR | Status: DC | PRN

## 2017-04-09 MED ORDER — ACETAMINOPHEN 500 MG PO TABS
ORAL_TABLET | ORAL | Status: AC
  Administered 2017-04-09: 1000 mg via ORAL
  Filled 2017-04-09: qty 2

## 2017-04-09 MED ORDER — PHENYLEPHRINE 40 MCG/ML (10ML) SYRINGE FOR IV PUSH (FOR BLOOD PRESSURE SUPPORT)
PREFILLED_SYRINGE | INTRAVENOUS | Status: AC
  Filled 2017-04-09: qty 10

## 2017-04-09 MED ORDER — FENTANYL CITRATE (PF) 100 MCG/2ML IJ SOLN
25.0000 ug | INTRAMUSCULAR | Status: DC | PRN
  Administered 2017-04-09 (×2): 50 ug via INTRAVENOUS

## 2017-04-09 MED ORDER — GABAPENTIN 300 MG PO CAPS
300.0000 mg | ORAL_CAPSULE | ORAL | Status: AC
  Administered 2017-04-09: 300 mg via ORAL

## 2017-04-09 MED ORDER — IOPAMIDOL (ISOVUE-300) INJECTION 61%
INTRAVENOUS | Status: AC
  Filled 2017-04-09: qty 50

## 2017-04-09 MED ORDER — DEXAMETHASONE SODIUM PHOSPHATE 10 MG/ML IJ SOLN
INTRAMUSCULAR | Status: DC | PRN
  Administered 2017-04-09: 10 mg via INTRAVENOUS

## 2017-04-09 MED ORDER — BUPIVACAINE-EPINEPHRINE (PF) 0.25% -1:200000 IJ SOLN
INTRAMUSCULAR | Status: AC
  Filled 2017-04-09: qty 30

## 2017-04-09 MED ORDER — ONDANSETRON HCL 4 MG/2ML IJ SOLN
INTRAMUSCULAR | Status: AC
  Filled 2017-04-09: qty 2

## 2017-04-09 MED ORDER — LACTATED RINGERS IV SOLN
INTRAVENOUS | Status: DC | PRN
  Administered 2017-04-09 (×2): via INTRAVENOUS

## 2017-04-09 MED ORDER — SUGAMMADEX SODIUM 200 MG/2ML IV SOLN
INTRAVENOUS | Status: DC | PRN
Start: 2017-04-09 — End: 2017-04-09
  Administered 2017-04-09: 200 mg via INTRAVENOUS

## 2017-04-09 MED ORDER — ROCURONIUM BROMIDE 100 MG/10ML IV SOLN
INTRAVENOUS | Status: DC | PRN
  Administered 2017-04-09: 50 mg via INTRAVENOUS
  Administered 2017-04-09: 10 mg via INTRAVENOUS

## 2017-04-09 MED ORDER — ACETAMINOPHEN 500 MG PO TABS
1000.0000 mg | ORAL_TABLET | ORAL | Status: AC
  Administered 2017-04-09: 1000 mg via ORAL

## 2017-04-09 MED ORDER — HYDROMORPHONE HCL 1 MG/ML IJ SOLN
0.2500 mg | INTRAMUSCULAR | Status: DC | PRN
  Administered 2017-04-09 (×4): 0.5 mg via INTRAVENOUS

## 2017-04-09 MED ORDER — PROPOFOL 10 MG/ML IV BOLUS
INTRAVENOUS | Status: DC | PRN
  Administered 2017-04-09: 150 mg via INTRAVENOUS

## 2017-04-09 MED ORDER — BUPIVACAINE-EPINEPHRINE 0.25% -1:200000 IJ SOLN
INTRAMUSCULAR | Status: DC | PRN
  Administered 2017-04-09: 30 mL

## 2017-04-09 MED ORDER — SODIUM CHLORIDE 0.9 % IJ SOLN
INTRAMUSCULAR | Status: AC
  Filled 2017-04-09: qty 10

## 2017-04-09 MED ORDER — MIDAZOLAM HCL 5 MG/5ML IJ SOLN
INTRAMUSCULAR | Status: DC | PRN
  Administered 2017-04-09: 2 mg via INTRAVENOUS

## 2017-04-09 MED ORDER — MIDAZOLAM HCL 2 MG/2ML IJ SOLN
INTRAMUSCULAR | Status: AC
  Filled 2017-04-09: qty 2

## 2017-04-09 MED ORDER — PROMETHAZINE HCL 25 MG/ML IJ SOLN: 6.2500 mg | INTRAMUSCULAR | Status: DC | PRN

## 2017-04-09 MED ORDER — GABAPENTIN 300 MG PO CAPS
ORAL_CAPSULE | ORAL | Status: AC
  Administered 2017-04-09: 300 mg via ORAL
  Filled 2017-04-09: qty 1

## 2017-04-09 MED ORDER — OXYCODONE HCL 5 MG PO TABS: 5.0000 mg | ORAL_TABLET | Freq: Four times a day (QID) | ORAL | 0 refills | Status: DC | PRN

## 2017-04-09 MED ORDER — SODIUM CHLORIDE 0.9 % IV SOLN
INTRAVENOUS | Status: DC | PRN
  Administered 2017-04-09: 100 mL

## 2017-04-09 MED ORDER — OXYCODONE HCL 5 MG PO TABS
ORAL_TABLET | ORAL | Status: AC
  Administered 2017-04-09: 5 mg via ORAL
  Filled 2017-04-09: qty 1

## 2017-04-09 MED ORDER — FENTANYL CITRATE (PF) 100 MCG/2ML IJ SOLN
INTRAMUSCULAR | Status: DC | PRN
  Administered 2017-04-09: 50 ug via INTRAVENOUS
  Administered 2017-04-09: 100 ug via INTRAVENOUS
  Administered 2017-04-09: 50 ug via INTRAVENOUS

## 2017-04-09 MED ORDER — FENTANYL CITRATE (PF) 100 MCG/2ML IJ SOLN
INTRAMUSCULAR | Status: AC
  Administered 2017-04-09: 50 ug via INTRAVENOUS
  Filled 2017-04-09: qty 2

## 2017-04-09 MED ORDER — GLYCOPYRROLATE 0.2 MG/ML IJ SOLN
INTRAMUSCULAR | Status: DC | PRN
  Administered 2017-04-09 (×2): 0.2 mg via INTRAVENOUS

## 2017-04-09 MED ORDER — KETOROLAC TROMETHAMINE 30 MG/ML IJ SOLN
INTRAMUSCULAR | Status: AC
  Filled 2017-04-09: qty 1

## 2017-04-09 MED ORDER — ARTIFICIAL TEARS OPHTHALMIC OINT
TOPICAL_OINTMENT | OPHTHALMIC | Status: AC
  Filled 2017-04-09: qty 3.5

## 2017-04-09 MED ORDER — 0.9 % SODIUM CHLORIDE (POUR BTL) OPTIME
TOPICAL | Status: DC | PRN
  Administered 2017-04-09: 1000 mL

## 2017-04-09 SURGICAL SUPPLY — 38 items
APPLIER CLIP ROT 10 11.4 M/L (STAPLE) ×3
BLADE CLIPPER SURG (BLADE) IMPLANT
CANISTER SUCT 3000ML PPV (MISCELLANEOUS) ×3 IMPLANT
CHLORAPREP W/TINT 26ML (MISCELLANEOUS) ×3 IMPLANT
CLIP APPLIE ROT 10 11.4 M/L (STAPLE) ×1 IMPLANT
COVER MAYO STAND STRL (DRAPES) ×3 IMPLANT
COVER SURGICAL LIGHT HANDLE (MISCELLANEOUS) ×3 IMPLANT
DERMABOND ADVANCED (GAUZE/BANDAGES/DRESSINGS) ×2
DERMABOND ADVANCED .7 DNX12 (GAUZE/BANDAGES/DRESSINGS) ×1 IMPLANT
DRAPE C-ARM 42X72 X-RAY (DRAPES) ×3 IMPLANT
DRAPE WARM FLUID 44X44 (DRAPE) ×3 IMPLANT
ELECT REM PT RETURN 9FT ADLT (ELECTROSURGICAL) ×3
ELECTRODE REM PT RTRN 9FT ADLT (ELECTROSURGICAL) ×1 IMPLANT
GLOVE BIO SURGEON STRL SZ8 (GLOVE) ×3 IMPLANT
GLOVE BIOGEL PI IND STRL 8 (GLOVE) ×1 IMPLANT
GLOVE BIOGEL PI INDICATOR 8 (GLOVE) ×2
GOWN STRL REUS W/ TWL LRG LVL3 (GOWN DISPOSABLE) ×2 IMPLANT
GOWN STRL REUS W/ TWL XL LVL3 (GOWN DISPOSABLE) ×1 IMPLANT
GOWN STRL REUS W/TWL LRG LVL3 (GOWN DISPOSABLE) ×4
GOWN STRL REUS W/TWL XL LVL3 (GOWN DISPOSABLE) ×2
KIT BASIN OR (CUSTOM PROCEDURE TRAY) ×3 IMPLANT
KIT ROOM TURNOVER OR (KITS) ×3 IMPLANT
NS IRRIG 1000ML POUR BTL (IV SOLUTION) ×3 IMPLANT
PAD ARMBOARD 7.5X6 YLW CONV (MISCELLANEOUS) ×3 IMPLANT
POUCH SPECIMEN RETRIEVAL 10MM (ENDOMECHANICALS) ×3 IMPLANT
SCISSORS LAP 5X35 DISP (ENDOMECHANICALS) ×3 IMPLANT
SET CHOLANGIOGRAPH 5 50 .035 (SET/KITS/TRAYS/PACK) ×3 IMPLANT
SET IRRIG TUBING LAPAROSCOPIC (IRRIGATION / IRRIGATOR) ×3 IMPLANT
SLEEVE ENDOPATH XCEL 5M (ENDOMECHANICALS) ×3 IMPLANT
SPECIMEN JAR SMALL (MISCELLANEOUS) ×3 IMPLANT
SUT MNCRL AB 4-0 PS2 18 (SUTURE) ×3 IMPLANT
TOWEL OR 17X24 6PK STRL BLUE (TOWEL DISPOSABLE) ×3 IMPLANT
TOWEL OR 17X26 10 PK STRL BLUE (TOWEL DISPOSABLE) ×3 IMPLANT
TRAY LAPAROSCOPIC MC (CUSTOM PROCEDURE TRAY) ×3 IMPLANT
TROCAR XCEL BLUNT TIP 100MML (ENDOMECHANICALS) ×3 IMPLANT
TROCAR XCEL NON-BLD 11X100MML (ENDOMECHANICALS) ×3 IMPLANT
TROCAR XCEL NON-BLD 5MMX100MML (ENDOMECHANICALS) ×3 IMPLANT
TUBING INSUFFLATION (TUBING) ×3 IMPLANT

## 2017-04-09 NOTE — Op Note (Signed)
Laparoscopic Cholecystectomy with IOC Procedure Note  Indications: This patient presents with symptomatic gallbladder disease and will undergo laparoscopic cholecystectomy.The procedure has been discussed with the patient. Operative and non operative treatments have been discussed. Risks of surgery include bleeding, infection,  Common bile duct injury,  Injury to the stomach,liver, colon,small intestine, abdominal wall,  Diaphragm,  Major blood vessels,  And the need for an open procedure.  Other risks include worsening of medical problems, death,  DVT and pulmonary embolism, and cardiovascular events.   Medical options have also been discussed. The patient has been informed of long term expectations of surgery and non surgical options,  The patient agrees to proceed.    Pre-operative Diagnosis: Abdominal pain, right upper quadrant  Post-operative Diagnosis: Chronic cholecystitis  Surgeon: Arien Morine A.   Assistants: White MD   Anesthesia: General endotracheal anesthesia and Local anesthesia 0.25.% bupivacaine, with epinephrine  ASA Class: 2  Procedure Details  The patient was seen again in the Holding Room. The risks, benefits, complications, treatment options, and expected outcomes were discussed with the patient. The possibilities of reaction to medication, pulmonary aspiration, perforation of viscus, bleeding, recurrent infection, finding a normal gallbladder, the need for additional procedures, failure to diagnose a condition, the possible need to convert to an open procedure, and creating a complication requiring transfusion or operation were discussed with the patient. The patient and/or family concurred with the proposed plan, giving informed consent. The site of surgery properly noted/marked. The patient was taken to Operating Room, identified as Douglas [name] and the procedure verified as Laparoscopic Cholecystectomy with Intraoperative Cholangiograms. A Time Out was held and the  above information confirmed.  Prior to the induction of general anesthesia, antibiotic prophylaxis was administered. General endotracheal anesthesia was then administered and tolerated well. After the induction, the abdomen was prepped in the usual sterile fashion. The patient was positioned in the supine position with the left arm comfortably tucked, along with some reverse Trendelenburg.  Local anesthetic agent was injected into the skin near the umbilicus and an incision made. The midline fascia was incised and the Hasson technique was used to introduce a 12 mm port under direct vision. It was secured with a figure of eight Vicryl suture placed in the usual fashion. Pneumoperitoneum was then created with CO2 and tolerated well without any adverse changes in the patient's vital signs. Additional trocars were introduced under direct vision with an 11 mm trocar in the epigastrium and two 5 mm trocars in the right upper quadrant. All skin incisions were infiltrated with a local anesthetic agent before making the incision and placing the trocars.   The gallbladder was identified, the fundus grasped and retracted cephalad. Adhesions were lysed bluntly and with the electrocautery where indicated, taking care not to injure any adjacent organs or viscus. The infundibulum was grasped and retracted laterally, exposing the peritoneum overlying the triangle of Calot. This was then divided and exposed in a blunt fashion. The cystic duct was clearly identified and bluntly dissected circumferentially. The junctions of the gallbladder, cystic duct and common bile duct were clearly identified prior to the division of any linear structure.   An incision was made in the cystic duct and the cholangiogram catheter introduced. The catheter was secured using an endoclip. The study showed no stones and good visualization of the distal and proximal biliary tree. The catheter was then removed.   The cystic duct was then  ligated  with surgical clips  on the patient side and  clipped  on the gallbladder side and divided. The cystic artery was identified, dissected free, ligated with clips and divided as well. Posterior cystic artery clipped and divided.  The gallbladder was dissected from the liver bed in retrograde fashion with the electrocautery. The gallbladder was removed. The liver bed was irrigated and inspected. Hemostasis was achieved with the electrocautery. Copious irrigation was utilized and was repeatedly aspirated until clear all particulate matter. Hemostasis was achieved with no signs of bleeding or bile leakage.  Pneumoperitoneum was completely reduced after viewing removal of the trocars under direct vision. The wound was thoroughly irrigated and the fascia was then closed with a figure of eight suture; the skin was then closed with 4 0 monocyl and a sterile dressing of Dermabond was applied.  Instrument, sponge, and needle counts were correct at closure and at the conclusion of the case.   Findings: Chronic cholecystitis  Estimated Blood Loss: less than 50 mL         Drains: none          Total IV Fluids: per record          Specimens: Gallbladder           Complications: None; patient tolerated the procedure well.         Disposition: PACU - hemodynamically stable.         Condition: stable

## 2017-04-09 NOTE — Anesthesia Postprocedure Evaluation (Signed)
Anesthesia Post Note  Patient: Douglas [name]  Procedure(s) Performed: Procedure(s) (LRB): LAPAROSCOPIC CHOLECYSTECTOMY WITH INTRAOPERATIVE CHOLANGIOGRAM (N/A)     Patient location during evaluation: PACU Anesthesia Type: General Level of consciousness: awake and alert Pain management: pain level controlled Vital Signs Assessment: post-procedure vital signs reviewed and stable Respiratory status: spontaneous breathing, nonlabored ventilation, respiratory function stable and patient connected to nasal cannula oxygen Cardiovascular status: blood pressure returned to baseline and stable Postop Assessment: no signs of nausea or vomiting Anesthetic complications: no    Last Vitals:  Vitals:   04/09/17 0621 04/09/17 0901  BP: 113/62 109/64  Pulse: (!) 56 60  Resp: 16 11  Temp: 36.4 C 36.4 C  SpO2: 100% 100%    Last Pain:  Vitals:   04/09/17 0901  TempSrc:   PainSc: Asleep                 Yigit Norkus S

## 2017-04-09 NOTE — Anesthesia Procedure Notes (Signed)
Procedure Name: Intubation Date/Time: 04/09/2017 7:38 AM Performed by: Shirlyn Goltz Pre-anesthesia Checklist: Patient identified, Emergency Drugs available, Suction available and Patient being monitored Patient Re-evaluated:Patient Re-evaluated prior to induction Oxygen Delivery Method: Circle system utilized Preoxygenation: Pre-oxygenation with 100% oxygen Induction Type: IV induction Ventilation: Mask ventilation without difficulty Laryngoscope Size: Mac and 4 Grade View: Grade I Tube type: Oral Tube size: 7.5 mm Number of attempts: 1 Airway Equipment and Method: Stylet and LTA kit utilized Placement Confirmation: ETT inserted through vocal cords under direct vision,  positive ETCO2 and breath sounds checked- equal and bilateral Secured at: 21 cm Tube secured with: Tape Dental Injury: Teeth and Oropharynx as per pre-operative assessment

## 2017-04-09 NOTE — Anesthesia Preprocedure Evaluation (Signed)
Anesthesia Evaluation  Patient identified by MRN, date of birth, ID band Patient awake    Reviewed: Allergy & Precautions, NPO status , Patient's Chart, lab work & pertinent test results  Airway Mallampati: II  TM Distance: >3 FB Neck ROM: Full    Dental no notable dental hx.    Pulmonary sleep apnea and Continuous Positive Airway Pressure Ventilation , former smoker,    Pulmonary exam normal breath sounds clear to auscultation       Cardiovascular negative cardio ROS Normal cardiovascular exam Rhythm:Regular Rate:Normal     Neuro/Psych negative neurological ROS  negative psych ROS   GI/Hepatic negative GI ROS, Neg liver ROS,   Endo/Other  negative endocrine ROS  Renal/GU negative Renal ROS  negative genitourinary   Musculoskeletal negative musculoskeletal ROS (+)   Abdominal   Peds negative pediatric ROS (+)  Hematology negative hematology ROS (+)   Anesthesia Other Findings   Reproductive/Obstetrics negative OB ROS                             Anesthesia Physical Anesthesia Plan  ASA: II  Anesthesia Plan: General   Post-op Pain Management:    Induction: Intravenous  PONV Risk Score and Plan: 2 and Ondansetron and Dexamethasone  Airway Management Planned: Oral ETT  Additional Equipment:   Intra-op Plan:   Post-operative Plan: Extubation in OR  Informed Consent: I have reviewed the patients History and Physical, chart, labs and discussed the procedure including the risks, benefits and alternatives for the proposed anesthesia with the patient or authorized representative who has indicated his/her understanding and acceptance.   Dental advisory given  Plan Discussed with: CRNA and Surgeon  Anesthesia Plan Comments:         Anesthesia Quick Evaluation

## 2017-04-09 NOTE — Interval H&P Note (Signed)
History and Physical Interval Note:  04/09/2017 6:52 AM  Douglas [name]  has presented today for surgery, with the diagnosis of ABDOMINAL PAIN  The various methods of treatment have been discussed with the patient and family. After consideration of risks, benefits and other options for treatment, the patient has consented to  Procedure(s): LAPAROSCOPIC CHOLECYSTECTOMY WITH INTRAOPERATIVE CHOLANGIOGRAM (N/A) as a surgical intervention .  The patient's history has been reviewed, patient examined, no change in status, stable for surgery.  I have reviewed the patient's chart and labs.  Questions were answered to the patient's satisfaction.     [name] A.

## 2017-04-09 NOTE — H&P (View-Only) (Signed)
Douglas [name] 03/30/2017 2:41 PM Location: [facility] Patient #: [id number] DOB: 1976/01/11 Married / Language: Douglas [name] / Race: White Male  History of Present Illness [name] A. Khalil Szczepanik MD; 03/30/2017 3:26 PM) Patient words: Patient presents for follow-up of abdominal pain. He was seen in the emergency room last week and for periumbilical abdominal pain. He's had a history of chronic abdominal pain the last few years. Most of it surrounding his umbilicus but sometimes he has issues with his upper stomach he states. He did have an endoscopy which showed some linear ulcers back in March this was treated with medications. He complains of getting abdominal pain after eating fatty greasy meals. He also has abdominal pain after dairy products. The patient the pain is usually around his umbilicus or his epigastrium. Sharp in nature. Its intermittent and comes and goes but it somewhat unpredictable he thinks. Abdominal CT scan was done the emergency room which showed a questionable very small umbilical hernia. He has never had an ultrasound. HIDA study was done earlier this year which showed an ejection fraction of 43% no pain with insured administration. His gallbladder did fill. He has been to multiple GI groups and unfortunately still has pain in his stomach. His groin pain is better he is followed by the pain clinic here in [place]. He's had a history of an open left inguinal hernia repair back in 2011 followed by bilateral laparoscopic repairs secondary to recurrence the left inguinal hernia. He denies groin pain today.                         CLINICAL DATA: Umbilical pain. History of bilateral inguinal hernia repair with mesh.  EXAM: CT ABDOMEN AND PELVIS WITH CONTRAST  TECHNIQUE: Multidetector CT imaging of the abdomen and pelvis was performed using the standard protocol following bolus administration of intravenous contrast.  CONTRAST:  167mL ISOVUE-300 IOPAMIDOL (ISOVUE-300) INJECTION 61%  COMPARISON: None.  FINDINGS: Lower chest: Cardiac motion related artifacts are believed to account for the linear hypodensities within the SVC and ascending aorta simulating dissection. If dissection is of clinical concern, a repeat CTA of the chest could be performed with cardiac gating. Bibasilar dependent atelectasis is noted.  Hepatobiliary: Hypodensity along the falciform ligament believed to represent fatty infiltration of the liver. No biliary dilatation. Normal gallbladder.  Pancreas: Normal  Spleen: Normal  Adrenals/Urinary Tract: Normal bilateral adrenal glands. No obstructive uropathy or enhancing renal mass. The urinary bladder is physiologically distended.  Stomach/Bowel: Small fat containing umbilical hernia is noted, new since previous exam. The stomach is physiologically distended with ingested food. No small bowel dilatation or inflammation. A moderate volume of fecal residue is seen within large bowel. Normal-appearing appendix. No bowel obstruction.  Vascular/Lymphatic: No significant vascular findings within the abdomen or pelvis. No enlarged abdominal or pelvic lymph nodes.  Reproductive: Central zone calcifications of prostate gland. Normal size prostate with unremarkable appearance of the seminal vesicles.  Other: No free air free fluid. Bilateral inguinal hernia repair without apparent recurrence.  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 No acute nor suspicious osseous abnormalities.  IMPRESSION: 1. New small fat containing umbilical hernia. 2. Bilateral inguinal hernia repair without recurrence. 3. Increased colonic stool burden. 4. No acute bowel obstruction or inflammation. 5. Cardiac motion related artifacts believed to account for linear hypodensities within the SVC and ascending aorta. Dissection is believed less likely. If this is of clinical concern however,  repeat CT imaging of the chest  with contrast and cardiac gating may prove useful.   Electronically Signed By: Ashley Royalty M.D. On: 03/15/2017 20:35.  The patient is a 41 year old male.   Past Surgical History (Douglas [name], [place]; 03/30/2017 2:41 PM) Laparoscopic Inguinal Hernia Surgery Right. Open Inguinal Hernia Surgery Left.  Diagnostic Studies History (Douglas [name], [place]; 03/30/2017 2:41 PM) Colonoscopy 1-5 years ago  Allergies (Douglas [name], [place]; 03/30/2017 2:42 PM) Sulfa Antibiotics Allergies Reconciled  Medication History (Douglas [name], [place]; 03/30/2017 2:43 PM) Propranolol HCl ER (120MG  Capsule ER 24HR, Oral) Active. Pantoprazole Sodium (40MG  Tablet DR, Oral) Active. Hydrocodone-Acetaminophen (5-325MG  Tablet, Oral) Active. Chlorzoxazone (500MG  Tablet, Oral) Active. Medications Reconciled  Social History (Douglas [name], [place]; 03/30/2017 2:41 PM) Alcohol use Occasional alcohol use. Caffeine use Tea. Illicit drug use Remotely quit drug use. Tobacco use Former smoker.  Family History (Douglas [name], [place]; 03/30/2017 2:41 PM) Arthritis Father, Mother. Diabetes Mellitus Father, Mother. Hypertension Father.  Other Problems (Douglas [name], [place]; 03/30/2017 2:41 PM) Back Pain Inguinal Hernia Sleep Apnea     Review of Systems (Douglas A. Brown RMA; 03/30/2017 2:41 PM) General Present- Weight Loss. Not Present- Appetite Loss, Chills, Fatigue, Fever, Night Sweats and Weight Gain. Skin Not Present- Change in Wart/Mole, Dryness, Hives, Jaundice, New Lesions, Non-Healing Wounds, Rash and Ulcer. HEENT Not Present- Earache, Hearing Loss, Hoarseness, Nose Bleed, Oral Ulcers, Ringing in the Ears, Seasonal Allergies, Sinus Pain, Sore Throat, Visual Disturbances, Wears glasses/contact lenses and Yellow Eyes. Respiratory Not Present- Bloody sputum, Chronic Cough, Difficulty Breathing, Snoring and Wheezing. Breast Not Present- Breast Mass, Breast  Pain, Nipple Discharge and Skin Changes. Cardiovascular Not Present- Chest Pain, Difficulty Breathing Lying Down, Leg Cramps, Palpitations, Rapid Heart Rate, Shortness of Breath and Swelling of Extremities. Gastrointestinal Present- Abdominal Pain. Not Present- Bloating, Bloody Stool, Change in Bowel Habits, Chronic diarrhea, Constipation, Difficulty Swallowing, Excessive gas, Gets full quickly at meals, Hemorrhoids, Indigestion, Nausea, Rectal Pain and Vomiting. Male Genitourinary Not Present- Blood in Urine, Change in Urinary Stream, Frequency, Impotence, Nocturia, Painful Urination, Urgency and Urine Leakage. Musculoskeletal Present- Back Pain. Not Present- Joint Pain, Joint Stiffness, Muscle Pain, Muscle Weakness and Swelling of Extremities. Neurological Present- Tremor. Not Present- Decreased Memory, Fainting, Headaches, Numbness, Seizures, Tingling, Trouble walking and Weakness. Psychiatric Not Present- Anxiety, Bipolar, Change in Sleep Pattern, Depression, Fearful and Frequent crying. Endocrine Not Present- Cold Intolerance, Excessive Hunger, Hair Changes, Heat Intolerance, Hot flashes and New Diabetes. Hematology Not Present- Blood Thinners, Easy Bruising, Excessive bleeding, Gland problems, HIV and Persistent Infections.  Vitals (Douglas A. Brown RMA; 03/30/2017 2:42 PM) 03/30/2017 2:41 PM Weight: 214.6 lb Height: 74in Body Surface Area: 2.24 m Body Mass Index: 27.55 kg/m  Temp.: 98.27F  Pulse: 54 (Regular)  P.OX: 98% (Room air) BP: 122/78 (Sitting, Left Arm, Standard)      Physical Exam (Mily Malecki A. Jasiel Belisle MD; 03/30/2017 3:27 PM)  General Mental Status-Alert. General Appearance-Consistent with stated age. Hydration-Well hydrated. Voice-Normal.  Head and Neck Head-normocephalic, atraumatic with no lesions or palpable masses.  Eye Eyeball - Bilateral-Extraocular movements intact. Sclera/Conjunctiva - Bilateral-No scleral icterus.  Abdomen Note:  Soft with minimal periumbilical tenderness. No palpable umbilical hernia I can feel. No rebound or guarding. Negative Murphy sign. Mild epigastric discomfort to palpation. Scars from laparoscopic hernia repair noted.  Neurologic Neurologic evaluation reveals -alert and oriented x 3 with no impairment of recent or remote memory. Mental Status-Normal.  Musculoskeletal Normal Exam - Left-Upper Extremity Strength Normal and Lower Extremity Strength Normal. Normal Exam - Right-Upper  Extremity Strength Normal, Lower Extremity Weakness.    Assessment & Plan (Vonette Grosso A. Ansley Stanwood MD; 03/30/2017 3:28 PM)  ABDOMINAL PAIN (R10.9) Impression: Discussed options with the patient today. He does have symptoms of biliary colic even though his HIDA scan was read as normal. His ejection fraction was 43%. He has a history of greasy fatty food intolerance as well as dairy product intolerance causing nausea, vomiting and abdominal pain. CT scan showed a questionable mobile hernia. It's unclear to me if this is just his umbilicus are very small umbilical hernia. I discussed options of observation. He's been under maximal medical therapy and workup has included endoscopy which showed some linear ulcers back in March. These have been treated. I've offered him cholecystectomy at this point since his workup was otherwise negative and he does have some signs of colic-like symptoms after eating fatty greasy meals. The area at his umbilicus can be evaluated during surgery better if he doesn't a small umbilical hernia this will be closed as part of the laparoscopic cholecystectomy since this is where the umbilical port site would go. He will talk with his wife but seems eager to proceed with cholecystectomy after our discussion today. The procedure has been discussed with the patient. Risks of laparoscopic cholecystectomy include bleeding, infection, bile duct injury, leak, death, open surgery, diarrhea, other surgery, organ  injury, blood vessel injury, DVT, and additional care.  Success rates for surgery are 25 - 75% for his problem. He understands and agrees to proceed once he talks with his wife.  Current Plans You are being scheduled for surgery- Our schedulers will call you.  You should hear from our office's scheduling department within 5 working days about the location, date, and time of surgery. We try to make accommodations for patient's preferences in scheduling surgery, but sometimes the OR schedule or the surgeon's schedule prevents [place] from making those accommodations.  If you have not heard from our office [phone]) in 5 working days, call the office and ask for your surgeon's nurse.  If you have other questions about your diagnosis, plan, or surgery, call the office and ask for your surgeon's nurse.  Pt Education - Pamphlet Given - Laparoscopic Gallbladder Surgery: discussed with patient and provided information. The anatomy & physiology of hepatobiliary & pancreatic function was discussed. The pathophysiology of gallbladder dysfunction was discussed. Natural history risks without surgery was discussed. I feel the risks of no intervention will lead to serious problems that outweigh the operative risks; therefore, I recommended cholecystectomy to remove the pathology. I explained laparoscopic techniques with possible need for an open approach. Probable cholangiogram to evaluate the bilary tract was explained as well.  Risks such as bleeding, infection, abscess, leak, injury to other organs, need for further treatment, heart attack, death, and other risks were discussed. I noted a good likelihood this will help address the problem. Possibility that this will not correct all abdominal symptoms was explained. Goals of post-operative recovery were discussed as well. We will work to minimize complications. An educational handout further explaining the pathology and treatment options was given  as well. Questions were answered. The patient expresses understanding & wishes to proceed with surgery.  Pt Education - [facility] Laparosopic Post Op HCI PERIUMBILICAL ABDOMINAL PAIN (R10.33)  PAIN OF UPPER ABDOMEN (Y70.62)  UMBILICAL HERNIA WITHOUT OBSTRUCTION AND WITHOUT GANGRENE (K42.9)

## 2017-04-09 NOTE — Discharge Instructions (Signed)
[facility] ______[facility] °LAPAROSCOPIC SURGERY: POST OP INSTRUCTIONS °Always review your discharge instruction sheet given to you by the facility where your surgery was performed. °IF YOU HAVE DISABILITY OR FAMILY LEAVE FORMS, YOU MUST BRING THEM TO THE OFFICE FOR PROCESSING.   °DO NOT GIVE THEM TO YOUR DOCTOR. ° °1. A prescription for pain medication may be given to you upon discharge.  Take your pain medication as prescribed, if needed.  If narcotic pain medicine is not needed, then you may take acetaminophen (Tylenol) or ibuprofen (Advil) as needed. °2. Take your usually prescribed medications unless otherwise directed. °3. If you need a refill on your pain medication, please contact your pharmacy.  They will contact our office to request authorization. Prescriptions will not be filled after 5pm or on week-ends. °4. You should follow a light diet the first few days after arrival home, such as soup and crackers, etc.  Be sure to include lots of fluids daily. °5. Most patients will experience some swelling and bruising in the area of the incisions.  Ice packs will help.  Swelling and bruising can take several days to resolve.  °6. It is common to experience some constipation if taking pain medication after surgery.  Increasing fluid intake and taking a stool softener (such as Colace) will usually help or prevent this problem from occurring.  A mild laxative (Milk of Magnesia or Miralax) should be taken according to package instructions if there are no bowel movements after 48 hours. °7. Unless discharge instructions indicate otherwise, you may remove your bandages 24-48 hours after surgery, and you may shower at that time.  You may have steri-strips (small skin tapes) in place directly over the incision.  These strips should be left on the skin for 7-10 days.  If your surgeon used skin glue on the incision, you may shower in 24 hours.  The glue will flake off over the next 2-3 weeks.  Any sutures or  staples will be removed at the office during your follow-up visit. °8. ACTIVITIES:  You may resume regular (light) daily activities beginning the next day--such as daily self-care, walking, climbing stairs--gradually increasing activities as tolerated.  You may have sexual intercourse when it is comfortable.  Refrain from any heavy lifting or straining until approved by your doctor. °a. You may drive when you are no longer taking prescription pain medication, you can comfortably wear a seatbelt, and you can safely maneuver your car and apply brakes. °b. RETURN TO WORK:  __________________________________________________________ °9. You should see your doctor in the office for a follow-up appointment approximately 2-3 weeks after your surgery.  Make sure that you call for this appointment within a day or two after you arrive home to insure a convenient appointment time. °10. OTHER INSTRUCTIONS: __________________________________________________________________________________________________________________________ __________________________________________________________________________________________________________________________ °WHEN TO CALL YOUR DOCTOR: °1. Fever over 101.0 °2. Inability to urinate °3. Continued bleeding from incision. °4. Increased pain, redness, or drainage from the incision. °5. Increasing abdominal pain ° °The clinic staff is available to answer your questions during regular business hours.  Please don’t hesitate to call and ask to speak to one of the nurses for clinical concerns.  If you have a medical emergency, go to the nearest emergency room or call 911.  A surgeon from [facility] is always on call at the hospital. °[address], [address], [place], [place]  [address] ? [address] [address], [place], [place]   [address] °[phone] ? [phone] ? FAX [phone] °Web site:   [email or web address] °

## 2017-04-09 NOTE — Transfer of Care (Signed)
Immediate Anesthesia Transfer of Care Note  Patient: Douglas [name]  Procedure(s) Performed: Procedure(s): LAPAROSCOPIC CHOLECYSTECTOMY WITH INTRAOPERATIVE CHOLANGIOGRAM (N/A)  Patient Location: PACU  Anesthesia Type:General  Level of Consciousness: awake, alert , oriented and patient cooperative  Airway & Oxygen Therapy: Patient Spontanous Breathing and Patient connected to nasal cannula oxygen  Post-op Assessment: Report given to RN and Post -op Vital signs reviewed and stable  Post vital signs: Reviewed and stable  Last Vitals:  Vitals:   04/09/17 0621 04/09/17 0901  BP: 113/62 (P) 109/64  Pulse: (!) 56 60  Resp: 16 (P) 11  Temp: 36.4 C 36.4 C  SpO2: 100% (P) 100%    Last Pain:  Vitals:   04/09/17 0901  TempSrc:   PainSc: (P) Asleep      Patients Stated Pain Goal: 4 (39/76/73 [id number])  Complications: No apparent anesthesia complications

## 2017-04-10 ENCOUNTER — Encounter (HOSPITAL_COMMUNITY): Payer: Self-pay | Admitting: Surgery

## 2017-10-28 ENCOUNTER — Other Ambulatory Visit: Payer: Self-pay | Admitting: Pain Medicine

## 2017-10-28 DIAGNOSIS — M5489 Other dorsalgia: Secondary | ICD-10-CM

## 2017-10-31 ENCOUNTER — Ambulatory Visit
Admission: RE | Admit: 2017-10-31 | Discharge: 2017-10-31 | Disposition: A | Discharge status: Home / Self Care | Payer: 59 | Source: Ambulatory Visit | Attending: Pain Medicine | Admitting: Pain Medicine

## 2017-10-31 DIAGNOSIS — M5489 Other dorsalgia: Secondary | ICD-10-CM

## 2018-04-16 ENCOUNTER — Other Ambulatory Visit: Payer: Self-pay | Admitting: Pain Medicine

## 2018-04-16 DIAGNOSIS — M5489 Other dorsalgia: Secondary | ICD-10-CM

## 2018-04-24 ENCOUNTER — Ambulatory Visit
Admission: RE | Admit: 2018-04-24 | Discharge: 2018-04-24 | Disposition: A | Discharge status: Home / Self Care | Payer: 59 | Source: Ambulatory Visit | Attending: Pain Medicine | Admitting: Pain Medicine

## 2018-04-24 DIAGNOSIS — M5489 Other dorsalgia: Secondary | ICD-10-CM

## 2018-05-18 ENCOUNTER — Other Ambulatory Visit: Payer: Self-pay | Admitting: Neurosurgery

## 2018-05-19 ENCOUNTER — Other Ambulatory Visit: Payer: Self-pay | Admitting: Neurosurgery

## 2018-06-02 ENCOUNTER — Other Ambulatory Visit: Payer: Self-pay | Admitting: Pain Medicine

## 2018-06-02 DIAGNOSIS — M5489 Other dorsalgia: Secondary | ICD-10-CM

## 2018-06-07 ENCOUNTER — Other Ambulatory Visit (HOSPITAL_COMMUNITY): Payer: 59

## 2018-06-12 ENCOUNTER — Ambulatory Visit
Admission: RE | Admit: 2018-06-12 | Discharge: 2018-06-12 | Disposition: A | Discharge status: Home / Self Care | Payer: 59 | Source: Ambulatory Visit | Attending: Pain Medicine | Admitting: Pain Medicine

## 2018-06-12 DIAGNOSIS — M5489 Other dorsalgia: Secondary | ICD-10-CM

## 2018-06-12 MED ORDER — GADOBENATE DIMEGLUMINE 529 MG/ML IV SOLN
20.0000 mL | Freq: Once | INTRAVENOUS | Status: AC | PRN
  Administered 2018-06-12: 20 mL via INTRAVENOUS

## 2018-06-14 ENCOUNTER — Encounter (HOSPITAL_COMMUNITY): Payer: Self-pay

## 2018-06-14 ENCOUNTER — Inpatient Hospital Stay (HOSPITAL_COMMUNITY): Admit: 2018-06-14 | Payer: 59 | Admitting: Neurosurgery

## 2018-06-14 SURGERY — SUBOCCIPITAL CRANIECTOMY CERVICAL LAMINECTOMY/DURAPLASTY
Anesthesia: General

## 2018-07-12 ENCOUNTER — Other Ambulatory Visit (HOSPITAL_COMMUNITY): Payer: Self-pay | Admitting: Interventional Radiology

## 2018-07-12 DIAGNOSIS — D1800 Hemangioma unspecified site: Secondary | ICD-10-CM

## 2018-07-16 ENCOUNTER — Ambulatory Visit (HOSPITAL_COMMUNITY)
Admission: RE | Admit: 2018-07-16 | Discharge: 2018-07-16 | Disposition: A | Discharge status: Home / Self Care | Payer: 59 | Source: Ambulatory Visit | Attending: Interventional Radiology | Admitting: Interventional Radiology

## 2018-07-16 DIAGNOSIS — D1800 Hemangioma unspecified site: Secondary | ICD-10-CM

## 2018-07-16 NOTE — Consult Note (Signed)
Chief Complaint: Patient was seen in consultation today for T6 and T7 hemangiomas.  Referring Physician(s): Ivan Croft  Supervising Physician: Luanne Bras  Patient Status: [facility] - Out-pt  History of Present Illness: Douglas [name] is a 42 y.o. male with a past medical history of headache, tremors, GERD, sleep apnea on CPAP, chronic back pain, and arthritis. He has had intermittent back pain for years without known injury/trauma. His pain began occasionally in his buttocks. However, his pain has become constant since 03/2018. He was managed by Dr. Vira Blanco (pain management), but was recently referred to Dr. Sherlyn Lick (spine and scoliosis specialist) for further management.  MR thoracic spine 06/12/2018: 1. 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 No further follow-up is necessary. 2. Otherwise negative MRI of the thoracic spine.  IR requested by Dr. Sherlyn Lick for management of T6 and T7 hemangiomas. Patient awake and alert sitting in chair. Complains of midline back pain that is located in between his shoulder blades and rotates around his ribs to his chest. States the pain is severe and rates pain 8.5-9/10. States pain is worse when standing vertical. States he takes one Hydrocodone in the AM and one Oxycodone in PM along with laying on a heating pad with little relief of pain. Denies numbness/tingling down arms/legs or bladder/bowel incontinence.   Past Medical History:  Diagnosis Date  . Arthritis   . GERD (gastroesophageal reflux disease)    occasional  . Headache(784.0)    cluster headache  . Neuromuscular disorder (HCC)    tremors  . Sleep apnea    cpap setting of 6    Past Surgical History:  Procedure Laterality Date  . CARPAL TUNNEL RELEASE Bilateral  june left, right july 2013  . CHOLECYSTECTOMY N/A 04/09/2017   Procedure: LAPAROSCOPIC CHOLECYSTECTOMY WITH INTRAOPERATIVE CHOLANGIOGRAM;  Surgeon: Erroll Luna, MD;  Location: [facility];  Service: General;  Laterality: N/A;  . ELBOW SURGERY     pins in left elbow for broken elbow  . HERNIA REPAIR  2011  . INGUINAL HERNIA REPAIR N/A 03/29/2013   Procedure: LAPAROSCOPIC BILATERAL INGUINAL HERNIA REPAIR;  Surgeon: Joyice Faster. Cornett, MD;  Location: [facility];  Service: General;  Laterality: N/A;  . INSERTION OF MESH N/A 03/29/2013   Procedure: INSERTION OF MESH;  Surgeon: Joyice Faster. Cornett, MD;  Location: [facility];  Service: General;  Laterality: N/A;  . INTERCOSTAL NERVE BLOCK  2015    Allergies: Rifampin and Sulfa drugs cross reactors  Medications: Prior to Admission medications   Medication Sig Start Date End Date Taking? Authorizing Provider  acetaminophen (TYLENOL) 500 MG tablet Take 1,000 mg by mouth every 8 (eight) hours as needed for headache.    [provider]  chlorzoxazone (PARAFON) 500 MG tablet Take 500 mg by mouth 2 (two) times daily as needed for muscle spasms.  01/01/17   [provider]  divalproex (DEPAKOTE ER) 500 MG 24 hr tablet Take 1 tablet (500 mg total) by mouth daily. Patient not taking: Reported on 04/02/2017 08/09/14   Melvenia Beam, MD  oxyCODONE (OXY IR/ROXICODONE) 5 MG immediate release tablet Take 1-2 tablets (5-10 mg total) by mouth every 6 (six) hours as needed for severe pain. 04/09/17   Cornett, [name], MD  pantoprazole (PROTONIX) 40 MG tablet Take 40 mg by mouth daily. 01/15/17   [provider]  propranolol ER (INDERAL LA) 120 MG 24 hr capsule Take 120 mg by mouth daily.     [provider]  Family History  Problem Relation Age of Onset  . Tremor Brother   . Brain cancer Maternal Grandfather     Social History   Socioeconomic History  . Marital status: Married    Spouse name: Douglas [name]  . Number of children: 2  . Years of education: GED  . Highest education level: Not on file  Occupational History    Employer: [employer]    Comment: [place]  Social Needs  . Financial resource strain: Not  on file  . Food insecurity:    Worry: Not on file    Inability: Not on file  . Transportation needs:    Medical: Not on file    Non-medical: Not on file  Tobacco Use  . Smoking status: Former Smoker    Packs/day: 1.00    Years: 8.00    Pack years: 8.00    Types: Cigarettes    Last attempt to quit: 02/07/1994    Years since quitting: 24.4  . Smokeless tobacco: Never Used  Substance and Sexual Activity  . Alcohol use: Yes    Comment: occasional 1-2 drinks/ month  . Drug use: No  . Sexual activity: Not on file  Lifestyle  . Physical activity:    Days per week: Not on file    Minutes per session: Not on file  . Stress: Not on file  Relationships  . Social connections:    Talks on phone: Not on file    Gets together: Not on file    Attends religious service: Not on file    Active member of club or organization: Not on file    Attends meetings of clubs or organizations: Not on file    Relationship status: Not on file  Other Topics Concern  . Not on file  Social History Narrative   Patient lives at home with wife.   Caffeine Use: 2 cups coffee daily   Patient has his GED    Patient has 2 children    Patient is right handed      Review of Systems: A 12 point ROS discussed and pertinent positives are indicated in the HPI above.  All other systems are negative.  Review of Systems  Constitutional: Negative for chills and fever.  Respiratory: Negative for shortness of breath and wheezing.   Cardiovascular: Negative for chest pain and palpitations.  Gastrointestinal:       Negative for bowel incontinence.  Genitourinary:       Negative for bladder incontinence.  Musculoskeletal: Positive for back pain.  Neurological: Negative for numbness.  Psychiatric/Behavioral: Negative for behavioral problems and confusion.    Vital Signs: There were no vitals taken for this visit.  Physical Exam  Constitutional: He is oriented to person, place, and time. He appears  well-developed and well-nourished. No distress.  Pulmonary/Chest: Effort normal. No respiratory distress.  Musculoskeletal:  Moderate-severe midline back pain at approximate level of T6/7.  Neurological: He is alert and oriented to person, place, and time.  Skin: Skin is warm and dry.  Psychiatric: He has a normal mood and affect. His behavior is normal. Judgment and thought content normal.     Imaging: No results found.  Labs:  CBC: No results for input(s): WBC, HGB, HCT, PLT in the last 8760 hours.  COAGS: No results for input(s): INR, APTT in the last 8760 hours.  BMP: No results for input(s): NA, K, CL, CO2, GLUCOSE, BUN, CALCIUM, CREATININE, GFRNONAA, GFRAA in the last 8760 hours.  Invalid input(s): CMP  LIVER FUNCTION TESTS: No results for input(s): BILITOT, AST, ALT, ALKPHOS, PROT, ALBUMIN in the last 8760 hours.  TUMOR MARKERS: No results for input(s): AFPTM, CEA, CA199, CHROMGRNA in the last 8760 hours.  Assessment and Plan:  T6 and T7 hemangiomas. Reviewed imaging with patient. Brought to his attention were his T6 and T7 hemangiomas. Informed patient that these are stable in size when comparing MR from 10/31/2017 and 06/12/2018. Explained that the indication for treating these with a kyphoplasty/vertebroplasty include hemangioma occupying greater than 50% of vertebral body or hemangioma causing a compression fracture. Informed patient that this is not the case for either of his hemangiomas. Because of this, procedure is not indicated at this time. However, since his pain is so severe, recommend urgent referral to [facility] for further management. Requested patient give [place] until early next week (by Tuesday 07/20/2018) to determine the most appropriate referral for him.  Plan for follow-up with urgent referral to [facility]. Informed patient that either our schedulers or someone from [facility] will call him with updates regarding this referral. Instructed patient to continue his pain  medication regimen as directed by his PCP/pain management physician.  All questions answered and concerns addressed. Patient conveys understanding and agrees with plan.  Thank you for this interesting consult.  I greatly enjoyed meeting Douglas [name] and look forward to participating in their care.  A copy of this report was sent to the requesting provider on this date.  Electronically Signed: Earley Abide, PA-C 07/16/2018, 10:35 AM   I spent a total of 40 Minutes in face to face in clinical consultation, greater than 50% of which was counseling/coordinating care for T6 and T7 hemangiomas.

## 2018-08-30 ENCOUNTER — Encounter (HOSPITAL_COMMUNITY): Payer: Self-pay | Admitting: *Deleted

## 2018-08-30 ENCOUNTER — Emergency Department (HOSPITAL_COMMUNITY)
Admission: EM | Admit: 2018-08-30 | Discharge: 2018-08-30 | Disposition: A | Discharge status: Home / Self Care | Payer: 59 | Attending: Emergency Medicine | Admitting: Emergency Medicine

## 2018-08-30 ENCOUNTER — Emergency Department (HOSPITAL_COMMUNITY): Payer: 59

## 2018-08-30 DIAGNOSIS — M546 Pain in thoracic spine: Secondary | ICD-10-CM | POA: Diagnosis not present

## 2018-08-30 DIAGNOSIS — Z87891 Personal history of nicotine dependence: Secondary | ICD-10-CM | POA: Diagnosis not present

## 2018-08-30 DIAGNOSIS — M549 Dorsalgia, unspecified: Secondary | ICD-10-CM | POA: Diagnosis present

## 2018-08-30 DIAGNOSIS — Z79899 Other long term (current) drug therapy: Secondary | ICD-10-CM | POA: Diagnosis not present

## 2018-08-30 LAB — BASIC METABOLIC PANEL
ANION GAP: 10 (ref 5–15)
BUN: 15 mg/dL (ref 6–20)
CALCIUM: 9 mg/dL (ref 8.9–10.3)
CO2: 26 mmol/L (ref 22–32)
CREATININE: 1.12 mg/dL (ref 0.61–1.24)
Chloride: 103 mmol/L (ref 98–111)
Glucose, Bld: 122 mg/dL — ABNORMAL HIGH (ref 70–99)
Potassium: 4.1 mmol/L (ref 3.5–5.1)
SODIUM: 139 mmol/L (ref 135–145)

## 2018-08-30 LAB — CBC WITH DIFFERENTIAL/PLATELET
Abs Immature Granulocytes: 0.02 10*3/uL (ref 0.00–0.07)
BASOS ABS: 0 10*3/uL (ref 0.0–0.1)
BASOS PCT: 1 %
EOS ABS: 0.1 10*3/uL (ref 0.0–0.5)
Eosinophils Relative: 1 %
HCT: 45 % (ref 39.0–52.0)
Hemoglobin: 15.1 g/dL (ref 13.0–17.0)
IMMATURE GRANULOCYTES: 0 %
Lymphocytes Relative: 26 %
Lymphs Abs: 1.9 10*3/uL (ref 0.7–4.0)
MCH: 30.4 pg (ref 26.0–34.0)
MCHC: 33.6 g/dL (ref 30.0–36.0)
MCV: 90.5 fL (ref 80.0–100.0)
Monocytes Absolute: 0.8 10*3/uL (ref 0.1–1.0)
Monocytes Relative: 10 %
NEUTROS PCT: 62 %
NRBC: 0 % (ref 0.0–0.2)
Neutro Abs: 4.7 10*3/uL (ref 1.7–7.7)
PLATELETS: 218 10*3/uL (ref 150–400)
RBC: 4.97 MIL/uL (ref 4.22–5.81)
RDW: 11.7 % (ref 11.5–15.5)
WBC: 7.6 10*3/uL (ref 4.0–10.5)

## 2018-08-30 LAB — I-STAT TROPONIN, ED: Troponin i, poc: 0 ng/mL (ref 0.00–0.08)

## 2018-08-30 MED ORDER — METHOCARBAMOL 500 MG PO TABS
750.0000 mg | ORAL_TABLET | Freq: Once | ORAL | Status: AC
  Administered 2018-08-30: 750 mg via ORAL
  Filled 2018-08-30: qty 2

## 2018-08-30 MED ORDER — LIDOCAINE 5 % EX PTCH: 1.0000 | MEDICATED_PATCH | CUTANEOUS | 0 refills | Status: DC

## 2018-08-30 MED ORDER — METHOCARBAMOL 500 MG PO TABS: 500.0000 mg | ORAL_TABLET | Freq: Two times a day (BID) | ORAL | 0 refills | Status: DC

## 2018-08-30 MED ORDER — HYDROMORPHONE HCL 1 MG/ML IJ SOLN
0.5000 mg | Freq: Once | INTRAMUSCULAR | Status: AC
  Administered 2018-08-30: 0.5 mg via INTRAVENOUS
  Filled 2018-08-30: qty 1

## 2018-08-30 NOTE — ED Notes (Signed)
Patient verbalizes understanding of discharge instructions. Opportunity for questioning and answers were provided. 

## 2018-08-30 NOTE — ED Provider Notes (Signed)
[facility] EMERGENCY DEPARTMENT Provider Note   CSN: [id number] Arrival date & time: 08/30/18  1614   History   Chief Complaint Chief Complaint  Patient presents with  . Back Pain    HPI Douglas [name] is a 43 y.o. male with past medical history significant for chronic back pain, chronic headache who presents for evaluation of back pain.  Patient is 10 days postop T7 kyphoplasty.  Patient states 3 days ago he turned around in his truck and felt a "pop" to the left side of his back.  Patient states he has had pain since the incident.  Rates his pain an 8/10 with movement, however states he does not have pain if he lies still.  Patient was seen at urgent care at day of accident with negative x-rays at that time.  Patient states his pain is sharp and stabbing as well as "spasms".  It is located to thoracic spine.  Not radiate.  Pain is aggravated by movement.  Has been taking his home oxycodone without relief of symptoms. Denies additional aggravating or alleviating factors.  Patient denies fever, chills, nausea, vomiting, decreased range of motion, numbness or tingling in his extremities, bowel or bladder incontinence, saddle paresthesias, chest pain, shortness of breath, cough, hemoptysis.  History obtained from patient.  No interpreter was used.  HPI  Past Medical History:  Diagnosis Date  . Arthritis   . GERD (gastroesophageal reflux disease)    occasional  . Headache(784.0)    cluster headache  . Neuromuscular disorder (HCC)    tremors  . Sleep apnea    cpap setting of 6    Patient Active Problem List   Diagnosis Date Noted  . 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 Post-op pain 04/29/2013  . Bilateral inguinal hernia 04/04/2013    Past Surgical History:  Procedure Laterality Date  . CARPAL TUNNEL RELEASE Bilateral  june left, right july 2013  . CHOLECYSTECTOMY N/A 04/09/2017   Procedure: LAPAROSCOPIC CHOLECYSTECTOMY WITH INTRAOPERATIVE CHOLANGIOGRAM;   Surgeon: Erroll Luna, MD;  Location: [facility];  Service: General;  Laterality: N/A;  . ELBOW SURGERY     pins in left elbow for broken elbow  . HERNIA REPAIR  2011  . INGUINAL HERNIA REPAIR N/A 03/29/2013   Procedure: LAPAROSCOPIC BILATERAL INGUINAL HERNIA REPAIR;  Surgeon: Joyice Faster. Cornett, MD;  Location[facility];  Service: General;  Laterality: N/A;  . INSERTION OF MESH N/A 03/29/2013   Procedure: INSERTION OF MESH;  Surgeon: Joyice Faster. Cornett, MD;  Location[facility];  Service: General;  Laterality: N/A;  . INTERCOSTAL NERVE BLOCK  2015        Home Medications    Prior to Admission medications   Medication Sig Start Date End Date Taking? Authorizing Provider  acetaminophen (TYLENOL) 500 MG tablet Take 1,000 mg by mouth every 8 (eight) hours as needed for headache.    [provider]  chlorzoxazone (PARAFON) 500 MG tablet Take 500 mg by mouth 2 (two) times daily as needed for muscle spasms.  01/01/17   [provider]  divalproex (DEPAKOTE ER) 500 MG 24 hr tablet Take 1 tablet (500 mg total) by mouth daily. Patient not taking: Reported on 04/02/2017 08/09/14   Melvenia Beam, MD  methocarbamol (ROBAXIN) 500 MG tablet Take 1 tablet (500 mg total) by mouth 2 (two) times daily. 08/30/18   Margaretmary Prisk A, PA-C  oxyCODONE (OXY IR/ROXICODONE) 5 MG immediate release tablet Take 1-2 tablets (5-10 mg total) by mouth every  6 (six) hours as needed for severe pain. 04/09/17   Cornett, [name], MD  pantoprazole (PROTONIX) 40 MG tablet Take 40 mg by mouth daily. 01/15/17   [provider]  propranolol ER (INDERAL LA) 120 MG 24 hr capsule Take 120 mg by mouth daily.     [provider]    Family History Family History  Problem Relation Age of Onset  . Tremor Brother   . Brain cancer Maternal Grandfather     Social History Social History   Tobacco Use  . Smoking status: Former Smoker    Packs/day: 1.00    Years: 8.00    Pack years: 8.00    Types:  Cigarettes    Last attempt to quit: 02/07/1994    Years since quitting: 24.5  . Smokeless tobacco: Never Used  Substance Use Topics  . Alcohol use: Yes    Comment: occasional 1-2 drinks/ month  . Drug use: No     Allergies   Rifampin and Sulfa drugs cross reactors   Review of Systems Review of Systems  Constitutional: Negative.   HENT: Negative.   Respiratory: Negative.   Cardiovascular: Negative.   Gastrointestinal: Negative.   Genitourinary: Negative.   Musculoskeletal: Positive for back pain.  Skin: Negative.   Neurological: Negative.   All other systems reviewed and are negative.    Physical Exam Updated Vital Signs BP 124/75   Pulse 65   Temp 98.2 F (36.8 C) (Oral)   Resp 16   SpO2 96%   Physical Exam  Physical Exam  Constitutional: Pt appears well-developed and well-nourished. No distress.  HENT:  Head: Normocephalic and atraumatic.  Mouth/Throat: Oropharynx is clear and moist. No oropharyngeal exudate.  Eyes: Conjunctivae are normal.  Neck: Normal range of motion. Neck supple.  Full ROM without pain  Cardiovascular: Normal rate, regular rhythm and intact distal pulses.   Pulmonary/Chest: Effort normal and breath sounds normal. No respiratory distress. Pt has no wheezes.  Abdominal: Soft. Pt exhibits no distension. There is no tenderness, rebound or guarding. No abd bruit or pulsatile mass Musculoskeletal:  Full range of motion of the T-spine and L-spine with flexion, hyperextension, and lateral flexion. No midline tenderness or stepoffs. No tenderness to palpation of the spinous processes of the T-spine or L-spine.  Paraspinal muscle tenderness to left thoracic spine. No tenderness to palpation of the paraspinous muscles of the L-spine. Negative straight leg raise. Lymphadenopathy:    Pt has no cervical adenopathy.  Neurological: Pt is alert. Pt has normal reflexes.  Reflex Scores:      Bicep reflexes are 2+ on the right side and 2+ on the left  side.      Brachioradialis reflexes are 2+ on the right side and 2+ on the left side.      Patellar reflexes are 2+ on the right side and 2+ on the left side.      Achilles reflexes are 2+ on the right side and 2+ on the left side. Speech is clear and goal oriented, follows commands Normal 5/5 strength in upper and lower extremities bilaterally including dorsiflexion and plantar flexion, strong and equal grip strength Sensation normal to light and sharp touch Moves extremities without ataxia, coordination intact Normal gait Normal balance No Clonus Skin: Skin is warm and dry. No rash noted or lesions noted. Pt is not diaphoretic. No erythema, ecchymosis,edema or warmth.  Psychiatric: Pt has a normal mood and affect. Behavior is normal.  Nursing note and vitals reviewed. ED Treatments /  Results  Labs (all labs ordered are listed, but only abnormal results are displayed) Labs Reviewed  BASIC METABOLIC PANEL - Abnormal; Notable for the following components:      Result Value   Glucose, Bld 122 (*)    All other components within normal limits  CBC WITH DIFFERENTIAL/PLATELET  I-STAT TROPONIN, ED    EKG None  Radiology Ct Thoracic Spine Wo Contrast  Result Date: 08/30/2018 CLINICAL DATA:  Thoracic spine for 2 days since a twisting injury. Initial encounter. EXAM: CT THORACIC SPINE WITHOUT CONTRAST TECHNIQUE: Multidetector CT images of the thoracic were obtained using the standard protocol without intravenous contrast. COMPARISON:  None. FINDINGS: Alignment: Maintained. Vertebrae: No fracture or worrisome lesion. The patient is status post T7 vertebral augmentation for remote fracture. Paraspinal and other soft tissues: Negative. Disc levels: Intervertebral disc space height is maintained. IMPRESSION: No acute abnormality. Status post vertebral augmentation at T7. Electronically Signed   By: Inge Rise M.D.   On: 08/30/2018 20:02    Procedures Procedures (including critical care  time)  Medications Ordered in ED Medications  methocarbamol (ROBAXIN) tablet 750 mg (750 mg Oral Given 08/30/18 1848)  HYDROmorphone (DILAUDID) injection 0.5 mg (0.5 mg Intravenous Given 08/30/18 1851)     Initial Impression / Assessment and Plan / ED Course  I have reviewed the triage vital signs and the nursing notes.  Pertinent labs & imaging results that were available during my care of the patient were reviewed by me and considered in my medical decision making (see chart for details).  7 old male who appears otherwise well presents for evaluation of left-sided thoracic back pain.  Patient is 10 days postop kyphoplasty to T7.  Patient 3 days ago twisted and felt a "pop" to his left thoracic spine.  Patient has been seen by urgent care with negative x-rays.  No midline thoracic or lumbar spinal tenderness.  Patient does have tenderness palpation over left thoracic back.  No red flags.  Afebrile, nonseptic, non-ill-appearing.  Labs unremarkable.  Negative troponin.  Low suspicion for PE causing symptoms.  Patient able to ambulate without difficulty in department.  CT scan thoracic spine negative for acute pathology.  Low suspicion for cauda equina, discitis, osteomyelitis, transverse myelitis, acute fracture or disc herniation.  Most likely with musculoskeletal pain.  Discussed follow-up with his surgeon for reevaluation. Patient with nonfocal neurologic exam without deficits.  No loss of bowel or bladder control. No fever, night sweats, weight loss, h/o cancer, IVDU.  RICE protocol and pain medicine indicated and discussed with patient.  Patient is hemodynamically stable and appropriate for DC home at this time.  I have discussed return precautions.  Patient voiced understanding and is agreeable for follow-up.    Final Clinical Impressions(s) / ED Diagnoses   Final diagnoses:  Acute left-sided thoracic back pain    ED Discharge Orders         Ordered    methocarbamol (ROBAXIN) 500 MG  tablet  2 times daily     08/30/18 2049           Shiana Rappleye A, PA-C 08/30/18 2056    Charlesetta Shanks, MD 08/30/18 2150

## 2018-08-30 NOTE — Discharge Instructions (Addendum)
Evaluated today for back pain.  Your CT was negative.  This is likely musculoskeletal in nature.  I have prescribed you muscle relaxers.  Please take as prescribed.  Please do not drive or operate heavy machinery while taking this medication.

## 2018-08-30 NOTE — ED Triage Notes (Signed)
Pt in after re-injuring his back two days ago, pt had surgery on his T7 Jan 10th and twisted in his truck to reach something and pain returned suddenly, ambulatory to room

## 2018-10-22 IMAGING — US US ABDOMEN LIMITED
1 series · 14 of 25 positions shown · non-contrast
Comparison: None.

CLINICAL DATA: Right upper quadrant pain.

EXAM:
ULTRASOUND ABDOMEN LIMITED RIGHT UPPER QUADRANT

[Series 1: us abdomen limited · 0.22mm/px · 14 of 37 slices shown]
[im 1/37]
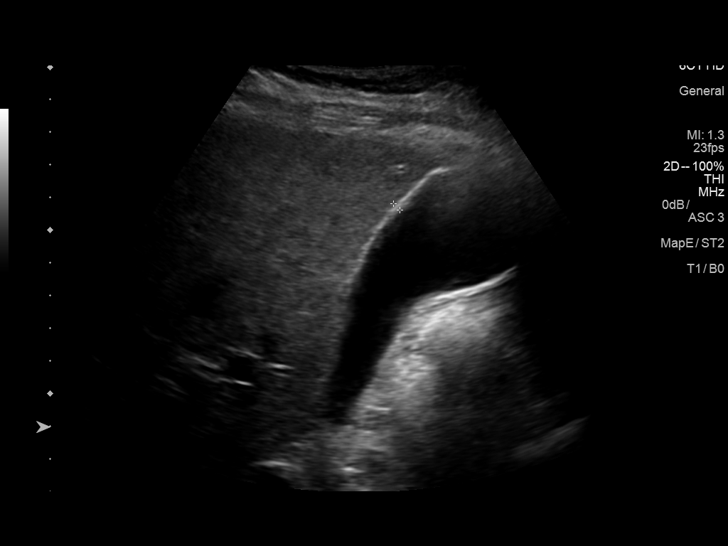
[im 4/37]
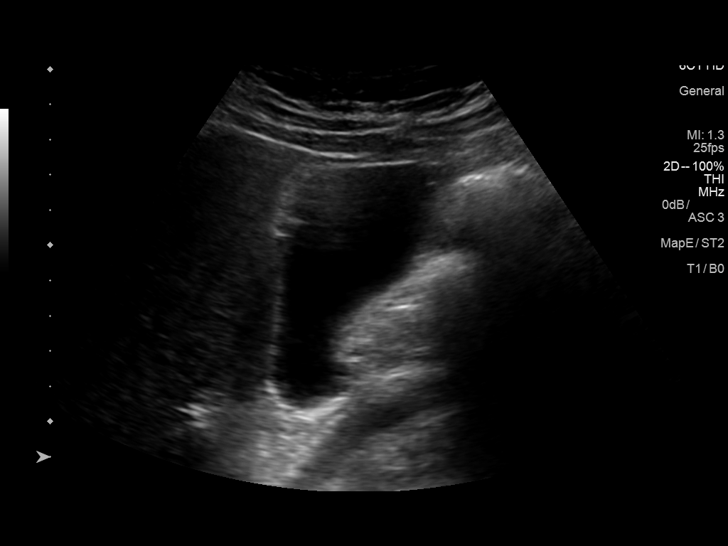
[im 7/37]
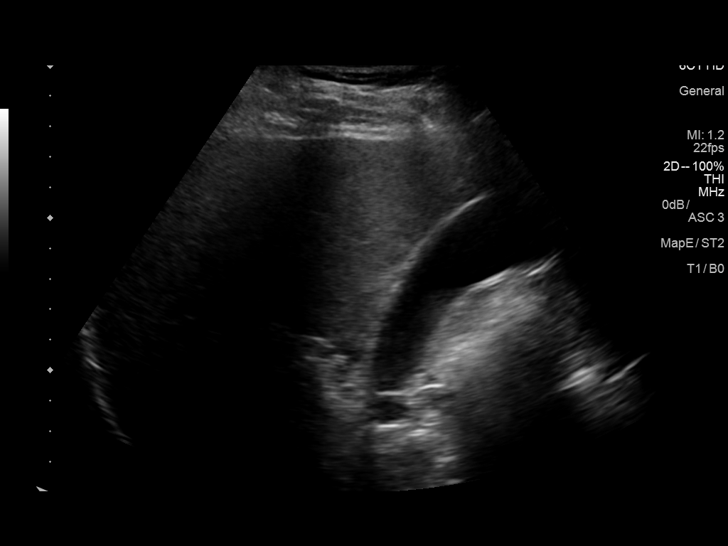
[im 10/37]
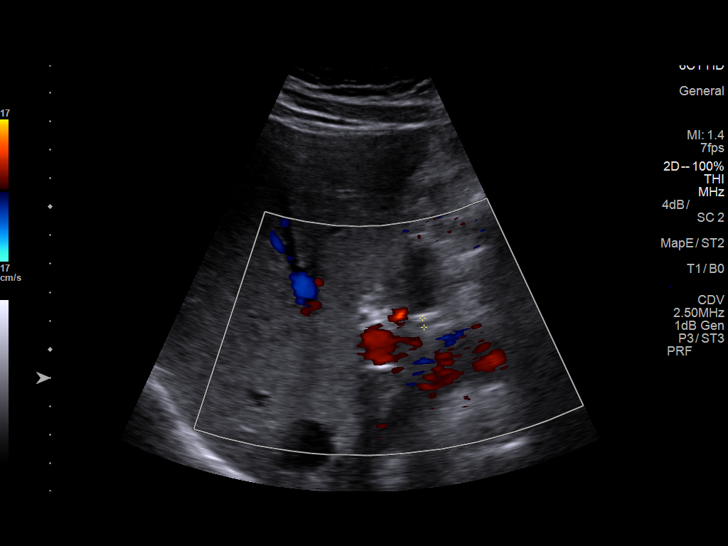
[im 13/37]
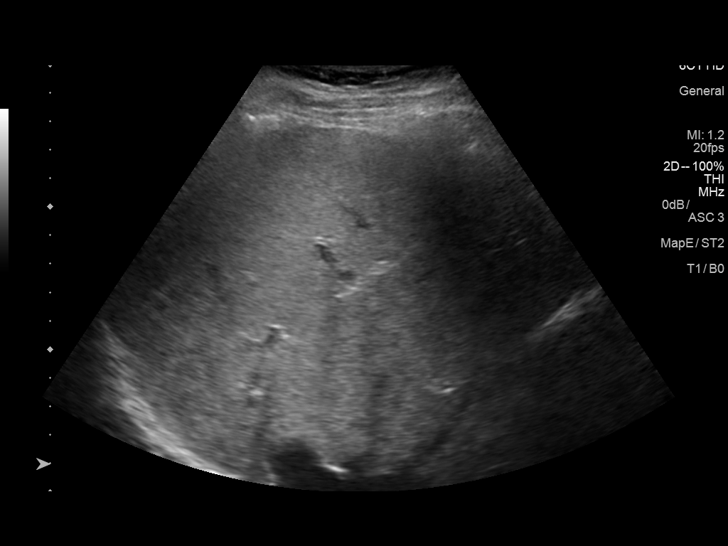
[im 14/37]
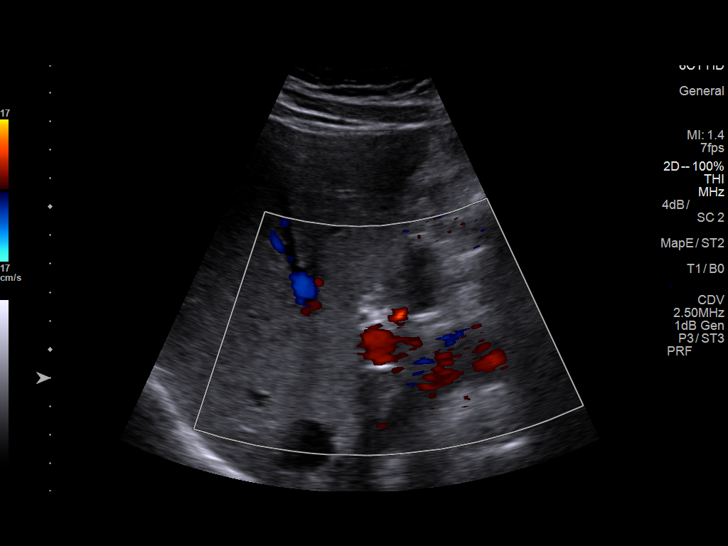
[im 17/37]
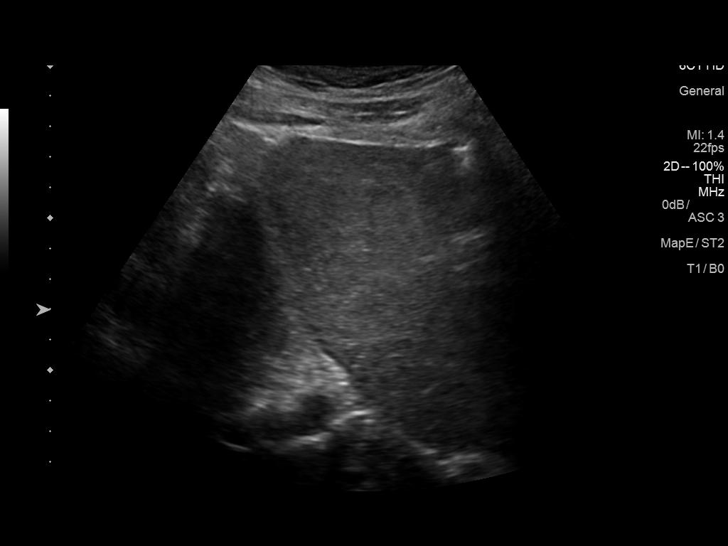
[im 20/37]
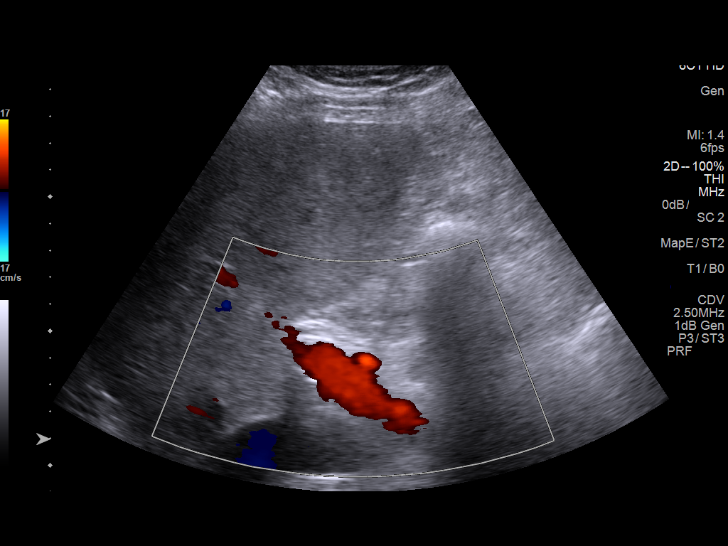
[im 23/37]
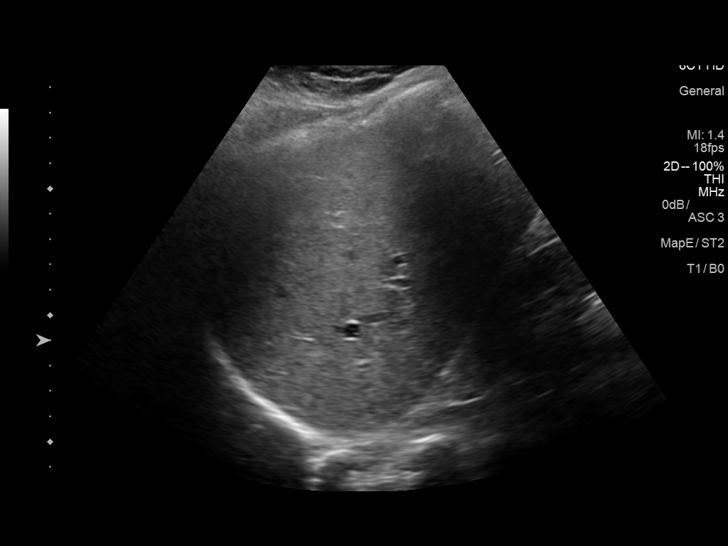
[im 25/37]
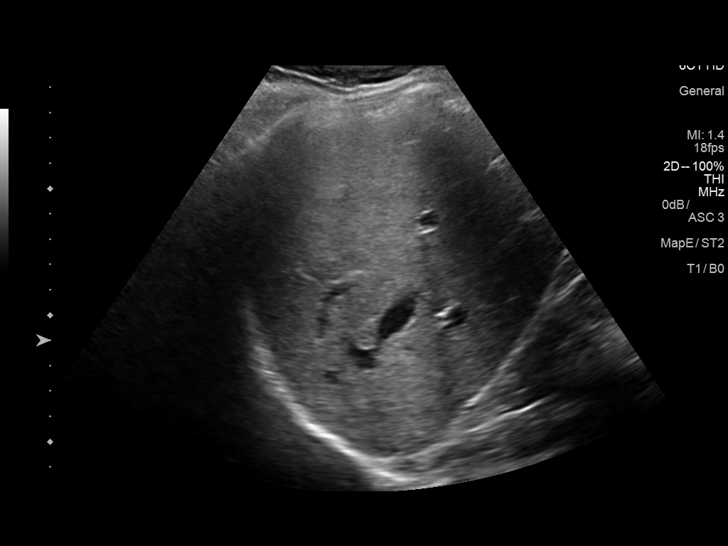
[im 28/37]
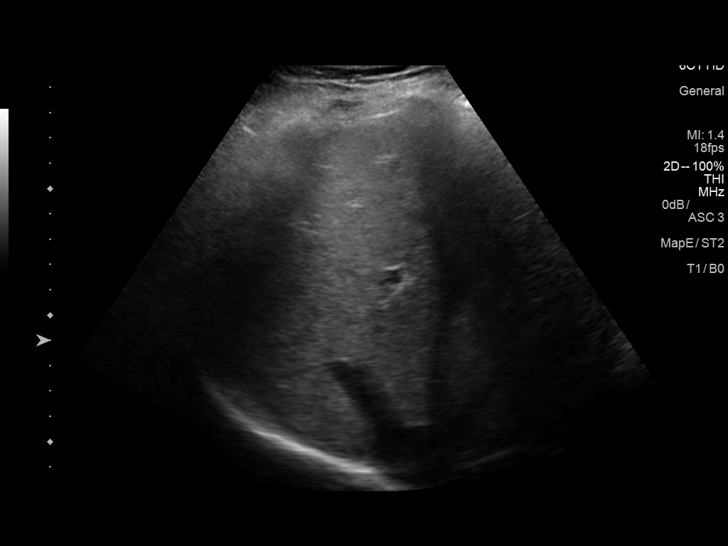
[im 31/37]
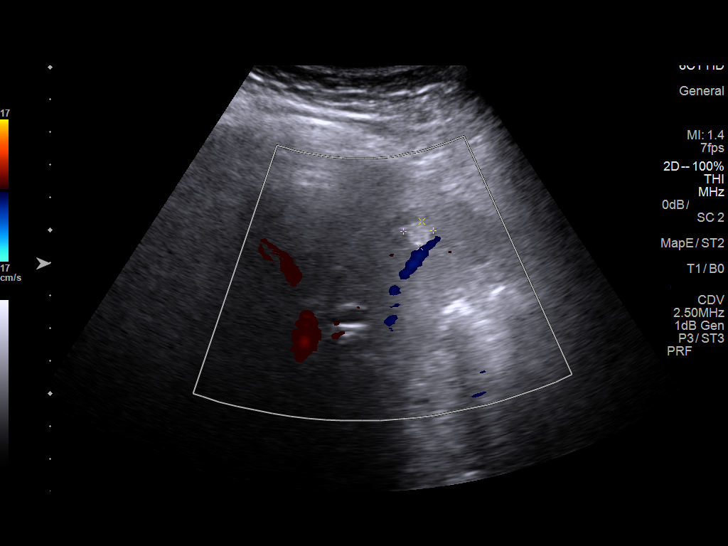
[im 34/37]
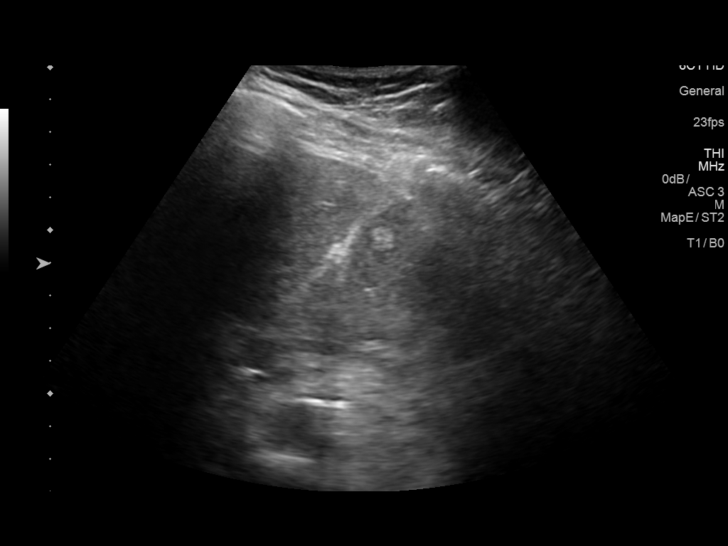
[im 37/37]
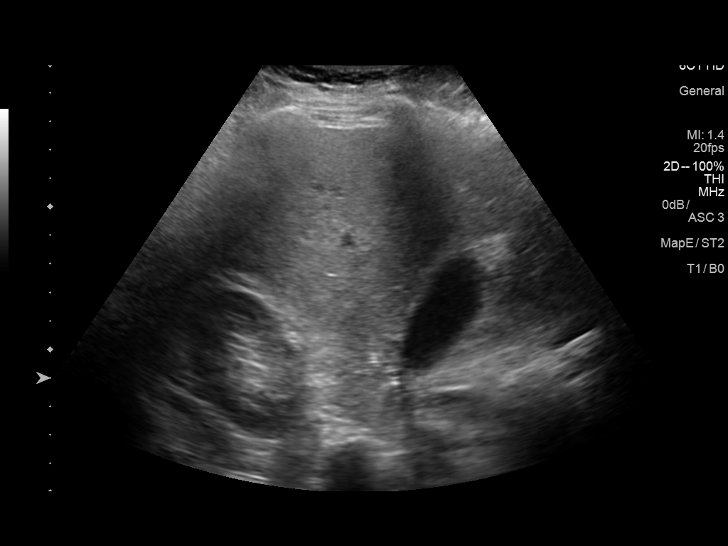

[14 of 25 positions shown; findings below may reference images not displayed]

FINDINGS: Gallbladder:

5.3 mm polyp in the anterior wall gallbladder. No gallstones noted.
No sonographic Murphy sign noted by sonographer.

Common bile duct:

Diameter: 3.0 mm

Liver:

Increased hepatic echogenicity consistent fatty infiltration and/or
hepatocellular disease. 0.9 cm hyperechoic lesion noted in the left
hepatic lobe, this is most likely a hemangioma. Portal vein is
patent on color Doppler imaging with normal direction of blood flow
towards the liver.
IMPRESSION: 1. 5.3 mm polyp noted in the anterior wall gallbladder. No
gallstones noted.

2. Increased hepatic echogenicity consistent with fatty infiltration
and/or hepatocellular disease. 0.9 cm hyperechoic lesion in the left
hepatic lobe, this is most likely a benign hemangioma.

## 2018-11-30 ENCOUNTER — Other Ambulatory Visit: Payer: Self-pay | Admitting: Pain Medicine

## 2018-11-30 DIAGNOSIS — M545 Low back pain, unspecified: Secondary | ICD-10-CM

## 2019-01-07 ENCOUNTER — Other Ambulatory Visit: Payer: Self-pay

## 2019-01-07 ENCOUNTER — Ambulatory Visit
Admission: RE | Admit: 2019-01-07 | Discharge: 2019-01-07 | Disposition: A | Discharge status: Home / Self Care | Payer: 59 | Source: Ambulatory Visit | Attending: Pain Medicine | Admitting: Pain Medicine

## 2019-01-07 DIAGNOSIS — M545 Low back pain, unspecified: Secondary | ICD-10-CM

## 2019-10-04 ENCOUNTER — Other Ambulatory Visit: Payer: Self-pay | Admitting: Pain Medicine

## 2019-10-04 DIAGNOSIS — M549 Dorsalgia, unspecified: Secondary | ICD-10-CM

## 2019-10-28 ENCOUNTER — Other Ambulatory Visit: Payer: 59

## 2019-10-31 ENCOUNTER — Ambulatory Visit
Admission: RE | Admit: 2019-10-31 | Discharge: 2019-10-31 | Disposition: A | Discharge status: Home / Self Care | Payer: 59 | Source: Ambulatory Visit | Attending: Pain Medicine | Admitting: Pain Medicine

## 2019-10-31 ENCOUNTER — Other Ambulatory Visit: Payer: Self-pay

## 2019-10-31 DIAGNOSIS — M549 Dorsalgia, unspecified: Secondary | ICD-10-CM

## 2020-02-06 ENCOUNTER — Telehealth: Payer: Self-pay | Admitting: Genetic Counselor

## 2020-02-06 NOTE — Telephone Encounter (Signed)
Received a genetic counseling referral from Dr. Louis Meckel for fhx of bladder cancer. Mr. Douglas [name] has been cld and scheduled to see Raquel Sarna on 7/13 at 2pm for an in person visit. Pt aware to arrive 15 minutes early.

## 2020-02-21 ENCOUNTER — Inpatient Hospital Stay: Payer: 59

## 2020-02-21 ENCOUNTER — Inpatient Hospital Stay: Payer: 59 | Attending: Genetic Counselor | Admitting: Genetic Counselor

## 2020-02-21 ENCOUNTER — Other Ambulatory Visit: Payer: Self-pay

## 2020-02-21 DIAGNOSIS — Z8052 Family history of malignant neoplasm of bladder: Secondary | ICD-10-CM

## 2020-02-21 DIAGNOSIS — Z808 Family history of malignant neoplasm of other organs or systems: Secondary | ICD-10-CM

## 2020-02-21 DIAGNOSIS — Z315 Encounter for genetic counseling: Secondary | ICD-10-CM

## 2020-02-22 NOTE — Progress Notes (Signed)
REFERRING PROVIDER: Ardis Hughs, MD [name],  Doug[name]  PRIMARY PROVIDER:  Chesley Noon, MD  PRIMARY REASON FOR VISIT:  No diagnosis found.    HISTORY OF PRESENT ILLNESS:   Douglas Douglas [name], a 44 y.o. male, was seen for a Douglas [name] cancer genetics consultation at the request of Douglas Douglas [name] due to a family history of cancer.  Douglas Douglas [name] presents to clinic today to discuss the possibility of a hereditary predisposition to cancer, genetic testing, and to further clarify his future cancer risks, as well as potential cancer risks for family members.   Douglas Douglas [name] is a 44 y.o. male with no personal history of cancer.     Past Medical History:  Diagnosis Date  . Arthritis   . GERD (gastroesophageal reflux disease)    occasional  . Headache(784.0)    cluster headache  . Neuromuscular disorder (HCC)    tremors  . Sleep apnea    cpap setting of 6    Past Surgical History:  Procedure Laterality Date  . CARPAL TUNNEL RELEASE Bilateral  june left, right july 2013  . CHOLECYSTECTOMY N/A 04/09/2017   Procedure: LAPAROSCOPIC CHOLECYSTECTOMY WITH INTRAOPERATIVE CHOLANGIOGRAM;  Surgeon: Douglas Luna, MD;  Location: [name];  Service: General;  Laterality: N/A;  . ELBOW SURGERY     pins in left elbow for broken elbow  . HERNIA REPAIR  2011  . INGUINAL HERNIA REPAIR N/A 03/29/2013   Procedure: LAPAROSCOPIC BILATERAL INGUINAL HERNIA REPAIR;  Surgeon: Douglas Faster. Cornett, MD;  Location: [name];  Service: General;  Laterality: N/A;  . INSERTION OF MESH N/A 03/29/2013   Procedure: INSERTION OF MESH;  Surgeon: Douglas Faster. Cornett, MD;  Location: [name];  Service: General;  Laterality: N/A;  . INTERCOSTAL NERVE BLOCK  2015    Social History   Socioeconomic History  . Marital status: Married    Spouse name: Douglas Douglas [name]  . Number of children: 2  . Years of education: GED  . Highest education level: Not on file  Occupational History    Employer: [name]    Comment:  Douglas [facility] [name]  Tobacco Use  . Smoking status: Former Smoker    Packs/day: 1.00    Years: 8.00    Pack years: 8.00    Types: Cigarettes    Quit date: 02/07/1994    Years since quitting: 26.0  . Smokeless tobacco: Never Used  Vaping Use  . Vaping Use: Never used  Substance and Sexual Activity  . Alcohol use: Yes    Comment: occasional 1-2 drinks/ month  . Drug use: No  . Sexual activity: Not on file  Other Topics Concern  . Not on file  Social History Narrative   Douglas Douglas [name].   Caffeine Use: 2 cups coffee daily   Douglas has his GED    Douglas has 2 children    Douglas is right handed    Social Determinants of Health   Financial Resource Strain:   . Difficulty of Paying Living Expenses:   Food Insecurity:   . Worried About Charity fundraiser in the Last Year:   . Douglas Douglas [name] in the Last Year:   Transportation Needs:   . Film/video editor (Medical):   Douglas [name] Kitchen Lack of Transportation (Non-Medical):   Physical Activity:   . Days of Exercise per Week:   . Minutes of Exercise per Session:   Stress:   . Feeling of Stress :  Social Connections:   . Frequency of Communication with Friends and Family:   . Frequency of Social Gatherings with Friends and Family:   . Attends Religious Services:   . Active Member of Clubs or Organizations:   . Attends Archivist Meetings:   Douglas [name] Kitchen Marital Status:      FAMILY HISTORY:  We obtained a detailed, 4-generation family history.  Significant diagnoses are listed below: Family History  Problem Relation Age of Onset  . Bladder Cancer Father 49  . Tremor Brother   . Brain cancer Maternal Grandfather        dx. mid-70s  . Bladder Cancer Paternal Grandfather 29  . Douglas Douglas [name] Daughter 8  . Tremor Daughter    Douglas Douglas [name] has two daughters - Douglas Douglas [name] (age 73) and Douglas Douglas [name] (age 22). Neither have had cancer. Douglas [name] has a history of Douglas Douglas [name] diagnosed when she was 46 years old. Douglas Douglas [name] has a  history of bladder problems for which she occasionally requires the use of a catheter, as well as a tremor. Douglas Douglas [name] has two brothers and one sister (ages 9-50). None of these siblings have had cancer, although one brother has tremors and a diagnosis of Parkinson's disease.  Douglas Douglas [name] mother is 35 and has not had cancer. He has three maternal aunts and one maternal uncle who are in their 97s and 42s, none of whom have had cancer. There is no known cancer among maternal cousins. His maternal grandmother died at the age of 81 without cancer, and his maternal grandfather died in his mid-8s from brain cancer. This grandfather also had blood clots and dragged his feet when he walked.   Douglas Douglas [name] father died at the age of 69, several months after he was diagnosed with bladder cancer. He never smoked, although he worked as a Scientist, water [name] and painted cars. Douglas Douglas [name] has one paternal uncle and one paternal aunt, neither have had cancer. There is no known cancer among paternal cousins. His paternal grandmother is 19 and has not had cancer, and his paternal grandfather died at the age of 38 from bladder cancer.  Douglas Douglas [name] is unaware of previous family history of genetic testing for hereditary cancer risks. Douglas's maternal ancestors are of [place] and Native American descent, and paternal ancestors are of [place] and [place] Douglas [name] descent. There is no reported Ashkenazi Jewish ancestry. There is no known consanguinity.  GENETIC COUNSELING ASSESSMENT: Douglas Douglas [name] is a 44 y.o. male with a family history of bladder cancer and brain cancer which is not suggestive of a hereditary cancer syndrome. We, therefore, discussed and recommended the following at today's visit.   DISCUSSION:  We discussed that, in general, most cancer is not inherited in families, but instead is sporadic or familial. Sporadic cancers occur by chance and typically happen at older ages (>50 years) as this type of cancer  is caused by genetic changes acquired during an individual's lifetime. Some families have more cancers than would be expected by chance; however, the ages or types of cancer are not consistent with a known genetic mutation or known genetic mutations have been ruled out. This type of familial cancer is thought to be due to a combination of multiple genetic, environmental, hormonal, and lifestyle factors. While this combination of factors likely increases the risk of cancer, the exact source of this risk is not currently identifiable or testable. There are several known risk factors for bladder cancer, including smoking, workplace exposures (e.g. painters, machinists, and printers), and high levels  of arsenic in drinking water.   We discussed that approximately 5-10% of cancer is hereditary, meaning that it is due to a mutation in a single gene that is passed down from generation to generation in a family. Most hereditary cases of colorectal cancer are due to Lynch syndrome. Lynch syndrome is a genetic condition that is associated mainly with an increased risk for colorectal and endometrial cancers, in addition to other cancer types. We discussed that identifying a hereditary cancer syndrome can be beneficial for several reasons, including knowing about other cancer risks, identifying potential screening and risk-reduction options that may be appropriate, and to understand if other family members could be at risk for cancer and allow them to undergo genetic testing.   We reviewed the characteristics, features and inheritance patterns of hereditary cancer syndromes. We discussed with Douglas Douglas [name] that the family history does not meet insurance or NCCN criteria for genetic testing and, therefore, is not highly consistent with a familial hereditary cancer syndrome. We feel he is at low risk to harbor a gene mutation associated with such a condition. Thus, we did not recommend any genetic testing at this time and  recommended Douglas Douglas [name] continue to follow the cancer screening guidelines given by his primary healthcare provider.  PLAN:  Douglas Douglas [name] did not meet criteria for genetic testing, and therefore did not pursue genetic testing at today's visit. We remain available to coordinate genetic testing at any time in the future. We, therefore, recommend Douglas Douglas [name] continue to follow the cancer screening guidelines given by his primary healthcare provider.  Lastly, we encouraged Douglas Douglas [name] to remain in contact with cancer genetics so that we can continuously update the family history and inform him of any changes in cancer genetics and testing that may be of benefit for this family.   Mr. Saraceno questions were answered to his satisfaction today. Our contact information was provided should additional questions or concerns arise. Thank you for the referral and allowing [place] to share in the care of your Douglas.   Clint Guy, [place], [facility] Licensed, Certified Dispensing [email or web address] Phone: [phone]  The Douglas was seen for a total of 50 minutes in face-to-face genetic counseling.  This Douglas was discussed with Drs. Magrinat, Lindi Adie and/or Burr Medico who agrees with the above.    _______________________________________________________________________ For Office Staff:  Number of people involved in session: 2 Was an Intern/ student involved with case: yes - observation only

## 2020-02-23 ENCOUNTER — Encounter: Payer: Self-pay | Admitting: Genetic Counselor

## 2020-02-23 DIAGNOSIS — Z8052 Family history of malignant neoplasm of bladder: Secondary | ICD-10-CM | POA: Insufficient documentation

## 2020-02-23 DIAGNOSIS — Z808 Family history of malignant neoplasm of other organs or systems: Secondary | ICD-10-CM | POA: Insufficient documentation

## 2020-08-02 ENCOUNTER — Other Ambulatory Visit: Payer: Self-pay | Admitting: Pain Medicine

## 2020-08-02 DIAGNOSIS — M549 Dorsalgia, unspecified: Secondary | ICD-10-CM

## 2020-08-30 ENCOUNTER — Ambulatory Visit
Admission: RE | Admit: 2020-08-30 | Discharge: 2020-08-30 | Disposition: A | Discharge status: Home / Self Care | Payer: 59 | Source: Ambulatory Visit | Attending: Pain Medicine | Admitting: Pain Medicine

## 2020-08-30 ENCOUNTER — Other Ambulatory Visit: Payer: Self-pay

## 2020-08-30 DIAGNOSIS — M549 Dorsalgia, unspecified: Secondary | ICD-10-CM

## 2020-08-30 MED ORDER — GADOBENATE DIMEGLUMINE 529 MG/ML IV SOLN
20.0000 mL | Freq: Once | INTRAVENOUS | Status: AC | PRN
  Administered 2020-08-30: 20 mL via INTRAVENOUS

## 2023-12-25 ENCOUNTER — Other Ambulatory Visit: Payer: Self-pay

## 2023-12-25 DIAGNOSIS — Q670 Congenital facial asymmetry: Secondary | ICD-10-CM

## 2023-12-28 ENCOUNTER — Ambulatory Visit
Admission: RE | Admit: 2023-12-28 | Discharge: 2023-12-28 | Disposition: A | Discharge status: Home / Self Care | Source: Ambulatory Visit

## 2023-12-28 DIAGNOSIS — Q670 Congenital facial asymmetry: Secondary | ICD-10-CM | POA: Diagnosis present

## 2024-03-18 ENCOUNTER — Ambulatory Visit (INDEPENDENT_AMBULATORY_CARE_PROVIDER_SITE_OTHER): Admitting: Nurse Practitioner

## 2024-03-18 ENCOUNTER — Encounter: Payer: Self-pay | Admitting: Nurse Practitioner

## 2024-03-18 VITALS — BP 109/75 | HR 68 | Temp 98.5°F | Ht 74.0 in | Wt 262.4 lb

## 2024-03-18 DIAGNOSIS — Z23 Encounter for immunization: Secondary | ICD-10-CM | POA: Diagnosis not present

## 2024-03-18 DIAGNOSIS — G250 Essential tremor: Secondary | ICD-10-CM | POA: Diagnosis not present

## 2024-03-18 DIAGNOSIS — Z6833 Body mass index (BMI) 33.0-33.9, adult: Secondary | ICD-10-CM | POA: Insufficient documentation

## 2024-03-18 MED ORDER — TIRZEPATIDE 7.5 MG/0.5ML ~~LOC~~ SOAJ: 7.5000 mg | SUBCUTANEOUS | 1 refills | Status: DC

## 2024-03-18 NOTE — Assessment & Plan Note (Signed)
 Chronic.  On Zepbound .  Insurance is making him switch to Mounjaro .  He will continue with 7.5mg  weekly.  Has tried [place] and had significant side effects with medication.  Follow up in 3 months.  Call sooner if concerns arise.

## 2024-03-18 NOTE — Progress Notes (Signed)
 BP 109/75   Pulse 68   Temp 98.5 F (36.9 C) (Oral)   Ht 6' 2 (1.88 m)   Wt 262 lb 6.4 oz (119 kg)   SpO2 96%   BMI 33.69 kg/m    Subjective:    Patient ID: Douglas [name], male    DOB: December 05, 1975, 48 y.o.   MRN: [id number]  HPI: Douglas [name] is a 48 y.o. male  Chief Complaint  Patient presents with   Establish Care   Patient presents to clinic to establish care with new PCP.  Introduced to Publishing rights manager role and practice setting.  All questions answered.  Discussed provider/patient relationship and expectations.  Patient reports a history of Parkinsons symptoms- Seeing Dr. Maree.  He has had testing done for Parksinson's which is negative.  Currently being treated for Essential tremors.  His brother and uncle both have been diagnosed with Parkinsons. He is on Propranolol  160mg  ER daily.     Patient denies a history of: Hypertension, Elevated Cholesterol, Diabetes, Thyroid problems, Depression, Anxiety, Neurological problems, and Abdominal problems.    Patient is currently on Zepbound .  He has tried [place] and had bad side effects with it.  He has had two shots on the 7.5mg  dose.  When answering question number 5 make sure to check that he has tried another GLP1 and then go down to Mounjaro .   Active Ambulatory Problems    Diagnosis Date Noted   Bilateral inguinal hernia 04/04/2013   Post-op pain 04/29/2013   Tension headache 05/15/2014   Family history of bladder cancer    Family history of brain cancer    Essential tremor 12/28/2014   BMI 33.0-33.9,adult 03/18/2024   Resolved Ambulatory Problems    Diagnosis Date Noted   No Resolved Ambulatory Problems   Past Medical History:  Diagnosis Date   Allergy    Arthritis ?   GERD (gastroesophageal reflux disease)    Headache(784.0)    Neuromuscular disorder (HCC)    Sleep apnea    Past Surgical History:  Procedure Laterality Date   CARPAL TUNNEL RELEASE Bilateral  june left, right july 2013    CHOLECYSTECTOMY N/A 04/09/2017   Procedure: LAPAROSCOPIC CHOLECYSTECTOMY WITH INTRAOPERATIVE CHOLANGIOGRAM;  Surgeon: Vanderbilt Ned, MD;  Location: [facility];  Service: General;  Laterality: N/A;   ELBOW SURGERY     pins in left elbow for broken elbow   FRACTURE SURGERY     Broke left elbow   HERNIA REPAIR  2011   INGUINAL HERNIA REPAIR N/A 03/29/2013   Procedure: LAPAROSCOPIC BILATERAL INGUINAL HERNIA REPAIR;  Surgeon: Ned LABOR. Cornett, MD;  Location: [facility];  Service: General;  Laterality: N/A;   INSERTION OF MESH N/A 03/29/2013   Procedure: INSERTION OF MESH;  Surgeon: Ned LABOR. Cornett, MD;  Location: [facility];  Service: General;  Laterality: N/A;   INTERCOSTAL NERVE BLOCK  2015   SPINE SURGERY     Kyphoplasti in T7 Vertebrae   Family History  Problem Relation Age of Onset   Arthritis Mother    Diabetes Mother    Obesity Mother    Bladder Cancer Father 67   Arthritis Father    Cancer Father    Diabetes Father    Early death Father    Heart disease Father    Kidney disease Father    Obesity Father    Varicose Veins Father    Tremor Brother    Brain cancer Maternal Grandfather        dx.  mid-70s   Obesity Sister    Bladder Cancer Paternal Grandfather 108   Obesity Brother    Ulcerative colitis Daughter 8   Tremor Daughter      Review of Systems  Constitutional:  Positive for unexpected weight change.  Neurological:  Positive for tremors.    Per HPI unless specifically indicated above     Objective:    BP 109/75   Pulse 68   Temp 98.5 F (36.9 C) (Oral)   Ht 6' 2 (1.88 m)   Wt 262 lb 6.4 oz (119 kg)   SpO2 96%   BMI 33.69 kg/m   Wt Readings from Last 3 Encounters:  03/18/24 262 lb 6.4 oz (119 kg)  04/07/17 215 lb 1.6 oz (97.6 kg)  07/27/14 254 lb 6.4 oz (115.4 kg)    Physical Exam Vitals and nursing note reviewed.  Constitutional:      General: He is not in acute distress.    Appearance: Normal appearance. He is not ill-appearing, toxic-appearing or  diaphoretic.  HENT:     Head: Normocephalic.     Right Ear: External ear normal.     Left Ear: External ear normal.     Nose: Nose normal. No congestion or rhinorrhea.     Mouth/Throat:     Mouth: Mucous membranes are moist.  Eyes:     General:        Right eye: No discharge.        Left eye: No discharge.     Extraocular Movements: Extraocular movements intact.     Conjunctiva/sclera: Conjunctivae normal.     Pupils: Pupils are equal, round, and reactive to light.  Cardiovascular:     Rate and Rhythm: Normal rate and regular rhythm.     Heart sounds: No murmur heard. Pulmonary:     Effort: Pulmonary effort is normal. No respiratory distress.     Breath sounds: Normal breath sounds. No wheezing, rhonchi or rales.  Abdominal:     General: Abdomen is flat. Bowel sounds are normal.  Musculoskeletal:     Cervical back: Normal range of motion and neck supple.  Skin:    General: Skin is warm and dry.     Capillary Refill: Capillary refill takes less than 2 seconds.  Neurological:     General: No focal deficit present.     Mental Status: He is alert and oriented to person, place, and time.  Psychiatric:        Mood and Affect: Mood normal.        Behavior: Behavior normal.        Thought Content: Thought content normal.        Judgment: Judgment normal.     Results for orders placed or performed during the hospital encounter of 08/30/18  CBC with Differential   Collection Time: 08/30/18  6:50 PM  Result Value Ref Range   WBC 7.6 4.0 - 10.5 K/uL   RBC 4.97 4.22 - 5.81 MIL/uL   Hemoglobin 15.1 13.0 - 17.0 g/dL   HCT 54.9 60.9 - 47.9 %   MCV 90.5 80.0 - 100.0 fL   MCH 30.4 26.0 - 34.0 pg   MCHC 33.6 30.0 - 36.0 g/dL   RDW 88.2 88.4 - 84.4 %   Platelets 218 150 - 400 K/uL   nRBC 0.0 0.0 - 0.2 %   Neutrophils Relative % 62 %   Neutro Abs 4.7 1.7 - 7.7 K/uL   Lymphocytes Relative 26 %   Lymphs Abs  1.9 0.7 - 4.0 K/uL   Monocytes Relative 10 %   Monocytes Absolute 0.8 0.1 -  1.0 K/uL   Eosinophils Relative 1 %   Eosinophils Absolute 0.1 0.0 - 0.5 K/uL   Basophils Relative 1 %   Basophils Absolute 0.0 0.0 - 0.1 K/uL   Immature Granulocytes 0 %   Abs Immature Granulocytes 0.02 0.00 - 0.07 K/uL  Basic metabolic panel   Collection Time: 08/30/18  6:50 PM  Result Value Ref Range   Sodium 139 135 - 145 mmol/L   Potassium 4.1 3.5 - 5.1 mmol/L   Chloride 103 98 - 111 mmol/L   CO2 26 22 - 32 mmol/L   Glucose, Bld 122 (H) 70 - 99 mg/dL   BUN 15 6 - 20 mg/dL   Creatinine, Ser 8.87 0.61 - 1.24 mg/dL   Calcium 9.0 8.9 - 89.6 mg/dL   GFR calc non Af Amer >60 >60 mL/min   GFR calc Af Amer >60 >60 mL/min   Anion gap 10 5 - 15  I-Stat Troponin, ED (not at [facility])   Collection Time: 08/30/18  6:59 PM  Result Value Ref Range   Troponin i, poc 0.00 0.00 - 0.08 ng/mL   Comment 3              Assessment & Plan:   Problem List Items Addressed This Visit       Nervous and Auditory   Essential tremor   Chronic.  Controlled.  Continue with current medication regimen of Propranolol .  Continue to follow up with Neurology.  Return to clinic in 6 months for reevaluation.  Call sooner if concerns arise.          Other   BMI 33.0-33.9,adult   Chronic.  On Zepbound .  Insurance is making him switch to Mounjaro .  He will continue with 7.5mg  weekly.  Has tried [place] and had significant side effects with medication.  Follow up in 3 months.  Call sooner if concerns arise.       Other Visit Diagnoses       Need for shingles vaccine    -  Primary   Relevant Orders   Zoster Recombinant (Shingrix  )        Follow up plan: Return in about 3 months (around 06/18/2024) for Weight Managment.

## 2024-03-18 NOTE — Assessment & Plan Note (Signed)
 Chronic.  Controlled.  Continue with current medication regimen of Propranolol .  Continue to follow up with Neurology.  Return to clinic in 6 months for reevaluation.  Call sooner if concerns arise.

## 2024-03-24 ENCOUNTER — Encounter: Payer: Self-pay | Admitting: Nurse Practitioner

## 2024-03-28 ENCOUNTER — Telehealth: Payer: Self-pay

## 2024-03-28 ENCOUNTER — Other Ambulatory Visit (HOSPITAL_COMMUNITY): Payer: Self-pay

## 2024-03-28 NOTE — Telephone Encounter (Signed)
PA request has been Submitted.

## 2024-03-28 NOTE — Telephone Encounter (Signed)
 Pharmacy Patient Advocate Encounter   Received notification from Physician's Office that prior authorization for Zepbound  7.5MG /0.5ML pen-injectors is required/requested.   Insurance verification completed.   The patient is insured through [facility] .   Per test claim: PA required; PA submitted to above mentioned insurance via Latent Key/confirmation #/EOC [id number] Status is pending

## 2024-04-01 NOTE — Telephone Encounter (Signed)
 Original PA closed, as previous denial is on file, per Latent note, PA has been resubmitted to the appeals dept.

## 2024-04-05 ENCOUNTER — Other Ambulatory Visit (HOSPITAL_COMMUNITY): Payer: Self-pay

## 2024-04-05 NOTE — Telephone Encounter (Signed)
 Per insurance, PA has been considered as an appeal due to original request being denied. This can take up to 30 days for insurance to turn over a decision.

## 2024-04-05 NOTE — Telephone Encounter (Signed)
 I was unable to find any information regarding the first PA that was denied, do we know anything about it's origins or who submitted so that we can attempt to locate denial letter/reasons? Thanks

## 2024-04-12 ENCOUNTER — Other Ambulatory Visit (HOSPITAL_COMMUNITY): Payer: Self-pay

## 2024-04-12 NOTE — Telephone Encounter (Signed)
 I don't know who submitted the original PA.  He is brand new to the practice.  Can it be resubmitted? In earlier messages it says we are going through appeal?

## 2024-04-14 MED ORDER — WEGOVY 1.7 MG/0.75ML ~~LOC~~ SOAJ
1.7000 mg | SUBCUTANEOUS | 2 refills | Status: DC
Start: 2024-04-14 — End: 2024-04-21

## 2024-04-14 NOTE — Addendum Note (Signed)
 Addended by: MELVIN PAO on: 04/14/2024 02:06 PM   Modules accepted: Orders

## 2024-04-21 MED ORDER — WEGOVY 0.25 MG/0.5ML ~~LOC~~ SOAJ: 0.2500 mg | SUBCUTANEOUS | 0 refills | Status: DC

## 2024-04-21 NOTE — Addendum Note (Signed)
 Addended by: MELVIN PAO on: 04/21/2024 12:48 PM   Modules accepted: Orders

## 2024-05-10 ENCOUNTER — Encounter: Payer: Self-pay | Admitting: Nurse Practitioner

## 2024-05-12 MED ORDER — WEGOVY 0.5 MG/0.5ML ~~LOC~~ SOAJ: 0.5000 mg | SUBCUTANEOUS | 2 refills | Status: DC

## 2024-06-10 ENCOUNTER — Ambulatory Visit
Admission: RE | Admit: 2024-06-10 | Discharge: 2024-06-10 | Disposition: A | Discharge status: Home / Self Care | Attending: Nurse Practitioner | Admitting: Nurse Practitioner

## 2024-06-10 ENCOUNTER — Other Ambulatory Visit: Payer: Self-pay | Admitting: Nurse Practitioner

## 2024-06-10 ENCOUNTER — Ambulatory Visit
Admission: RE | Admit: 2024-06-10 | Discharge: 2024-06-10 | Disposition: A | Discharge status: Home / Self Care | Source: Ambulatory Visit | Attending: Nurse Practitioner | Admitting: Nurse Practitioner

## 2024-06-10 DIAGNOSIS — M545 Low back pain, unspecified: Secondary | ICD-10-CM

## 2024-06-10 DIAGNOSIS — M542 Cervicalgia: Secondary | ICD-10-CM

## 2024-06-10 DIAGNOSIS — M546 Pain in thoracic spine: Secondary | ICD-10-CM

## 2024-06-15 ENCOUNTER — Telehealth: Payer: Self-pay | Admitting: Nurse Practitioner

## 2024-06-15 ENCOUNTER — Encounter: Payer: Self-pay | Admitting: Nurse Practitioner

## 2024-06-15 DIAGNOSIS — R911 Solitary pulmonary nodule: Secondary | ICD-10-CM

## 2024-06-15 NOTE — Telephone Encounter (Signed)
 Noted. Will give provider results once they are received.

## 2024-06-15 NOTE — Telephone Encounter (Signed)
 Copied from CRM [id number]. Topic: Clinical - Lab/Test Results >> Jun 15, 2024  3:33 PM Donee H wrote: Reason for CRM:  Asharee from [facility] called requesting fax number. She states there was something found abnormal on patient's MRI of lower back. She will be faxing imaging over in attention to pcp . She also states pcp should view fax as soon as possible.   Callback number [phone]

## 2024-06-16 ENCOUNTER — Other Ambulatory Visit: Payer: Self-pay | Admitting: Medical Genetics

## 2024-06-21 ENCOUNTER — Ambulatory Visit
Admission: RE | Admit: 2024-06-21 | Discharge: 2024-06-21 | Disposition: A | Discharge status: Home / Self Care | Source: Ambulatory Visit | Attending: Nurse Practitioner | Admitting: Nurse Practitioner

## 2024-06-21 DIAGNOSIS — R911 Solitary pulmonary nodule: Secondary | ICD-10-CM | POA: Insufficient documentation

## 2024-06-21 NOTE — Telephone Encounter (Signed)
 Imaging results not received. Called over to [facility] and spoke with Asharee. Let her know that we have not received imaging and she is faxing them over to us .

## 2024-06-23 ENCOUNTER — Ambulatory Visit (INDEPENDENT_AMBULATORY_CARE_PROVIDER_SITE_OTHER): Admitting: Nurse Practitioner

## 2024-06-23 ENCOUNTER — Encounter: Payer: Self-pay | Admitting: Nurse Practitioner

## 2024-06-23 VITALS — BP 123/70 | HR 62 | Temp 98.5°F | Ht 74.0 in | Wt 257.8 lb

## 2024-06-23 DIAGNOSIS — Z6833 Body mass index (BMI) 33.0-33.9, adult: Secondary | ICD-10-CM

## 2024-06-23 MED ORDER — WEGOVY 1 MG/0.5ML ~~LOC~~ SOAJ: 1.0000 mg | SUBCUTANEOUS | 2 refills | Status: DC

## 2024-06-23 NOTE — Progress Notes (Signed)
 BP 123/70 (BP Location: Right Arm, Cuff Size: Large)   Pulse 62   Temp 98.5 F (36.9 C) (Oral)   Ht 6' 2 (1.88 m)   Wt 257 lb 12.8 oz (116.9 kg)   SpO2 97%   BMI 33.10 kg/m    Subjective:    Patient ID: Douglas [name], male    DOB: 1975/11/28, 48 y.o.   MRN: [id number]  HPI: Douglas [name] is a 48 y.o. male  Chief Complaint  Patient presents with   Weight Check    Pt under able to get Mounjaro  so went on Wegovy  0.5. Would like to increase dose to 1 mg. Patient is working with a nutritionist.   Back Pain   WEIGHT MANAGEMENT Duration: years Patient has lost 5lbs since last visit.  He was unable to get Mounjaro  but is currently taking Wegovy  on 0.5mg  weekly.  He would like to increase the dose to 1mg .  Not currently having any side effects.  He is working with a health and safety inspector.  He feels like he is still eating more than he needs to and he is trying to cut back.  He is walking his dogs and trying to get on the [employer] a couple of times per week.  Relevant past medical, surgical, family and social history reviewed and updated as indicated. Interim medical history since our last visit reviewed. Allergies and medications reviewed and updated.  Review of Systems  Constitutional:  Negative for unexpected weight change.    Per HPI unless specifically indicated above     Objective:    BP 123/70 (BP Location: Right Arm, Cuff Size: Large)   Pulse 62   Temp 98.5 F (36.9 C) (Oral)   Ht 6' 2 (1.88 m)   Wt 257 lb 12.8 oz (116.9 kg)   SpO2 97%   BMI 33.10 kg/m   Wt Readings from Last 3 Encounters:  06/23/24 257 lb 12.8 oz (116.9 kg)  03/18/24 262 lb 6.4 oz (119 kg)  04/07/17 215 lb 1.6 oz (97.6 kg)    Physical Exam Vitals and nursing note reviewed.  Constitutional:      General: He is not in acute distress.    Appearance: Normal appearance. He is obese. He is not ill-appearing, toxic-appearing or diaphoretic.  HENT:     Head: Normocephalic.     Right Ear:  External ear normal.     Left Ear: External ear normal.     Nose: Nose normal. No congestion or rhinorrhea.     Mouth/Throat:     Mouth: Mucous membranes are moist.  Eyes:     General:        Right eye: No discharge.        Left eye: No discharge.     Extraocular Movements: Extraocular movements intact.     Conjunctiva/sclera: Conjunctivae normal.     Pupils: Pupils are equal, round, and reactive to light.  Cardiovascular:     Rate and Rhythm: Normal rate and regular rhythm.     Heart sounds: No murmur heard. Pulmonary:     Effort: Pulmonary effort is normal. No respiratory distress.     Breath sounds: Normal breath sounds. No wheezing, rhonchi or rales.  Abdominal:     General: Abdomen is flat. Bowel sounds are normal.  Musculoskeletal:     Cervical back: Normal range of motion and neck supple.  Skin:    General: Skin is warm and dry.     Capillary Refill: Capillary refill takes less  than 2 seconds.  Neurological:     General: No focal deficit present.     Mental Status: He is alert and oriented to person, place, and time.  Psychiatric:        Mood and Affect: Mood normal.        Behavior: Behavior normal.        Thought Content: Thought content normal.        Judgment: Judgment normal.     Results for orders placed or performed during the hospital encounter of 08/30/18  CBC with Differential   Collection Time: 08/30/18  6:50 PM  Result Value Ref Range   WBC 7.6 4.0 - 10.5 K/uL   RBC 4.97 4.22 - 5.81 MIL/uL   Hemoglobin 15.1 13.0 - 17.0 g/dL   HCT 54.9 60.9 - 47.9 %   MCV 90.5 80.0 - 100.0 fL   MCH 30.4 26.0 - 34.0 pg   MCHC 33.6 30.0 - 36.0 g/dL   RDW 88.2 88.4 - 84.4 %   Platelets 218 150 - 400 K/uL   nRBC 0.0 0.0 - 0.2 %   Neutrophils Relative % 62 %   Neutro Abs 4.7 1.7 - 7.7 K/uL   Lymphocytes Relative 26 %   Lymphs Abs 1.9 0.7 - 4.0 K/uL   Monocytes Relative 10 %   Monocytes Absolute 0.8 0.1 - 1.0 K/uL   Eosinophils Relative 1 %   Eosinophils Absolute  0.1 0.0 - 0.5 K/uL   Basophils Relative 1 %   Basophils Absolute 0.0 0.0 - 0.1 K/uL   Immature Granulocytes 0 %   Abs Immature Granulocytes 0.02 0.00 - 0.07 K/uL  Basic metabolic panel   Collection Time: 08/30/18  6:50 PM  Result Value Ref Range   Sodium 139 135 - 145 mmol/L   Potassium 4.1 3.5 - 5.1 mmol/L   Chloride 103 98 - 111 mmol/L   CO2 26 22 - 32 mmol/L   Glucose, Bld 122 (H) 70 - 99 mg/dL   BUN 15 6 - 20 mg/dL   Creatinine, Ser 8.87 0.61 - 1.24 mg/dL   Calcium 9.0 8.9 - 89.6 mg/dL   GFR calc non Af Amer >60 >60 mL/min   GFR calc Af Amer >60 >60 mL/min   Anion gap 10 5 - 15  I-Stat Troponin, ED (not at [facility])   Collection Time: 08/30/18  6:59 PM  Result Value Ref Range   Troponin i, poc 0.00 0.00 - 0.08 ng/mL   Comment 3              Assessment & Plan:   Problem List Items Addressed This Visit       Other   BMI 33.0-33.9,adult - Primary   Chronic.  Ongoing concern.  Has lost 5lbs since last visit.  Will increase dose of Wegovy  to 1mg . Not having side effects. Continue to work with nutritionist.  Recommend increasing exercise.  Follow up in 3 months. Call sooner if concerns arise.         Follow up plan: Return in about 3 months (around 09/23/2024) for Physical and Fasting labs, Weight Managment.

## 2024-06-23 NOTE — Assessment & Plan Note (Signed)
 Chronic.  Ongoing concern.  Has lost 5lbs since last visit.  Will increase dose of Wegovy  to 1mg . Not having side effects. Continue to work with nutritionist.  Recommend increasing exercise.  Follow up in 3 months. Call sooner if concerns arise.

## 2024-06-24 ENCOUNTER — Other Ambulatory Visit: Payer: Self-pay | Admitting: Nurse Practitioner

## 2024-06-24 ENCOUNTER — Ambulatory Visit: Payer: Self-pay | Admitting: Nurse Practitioner

## 2024-06-24 DIAGNOSIS — M5414 Radiculopathy, thoracic region: Secondary | ICD-10-CM

## 2024-06-25 ENCOUNTER — Ambulatory Visit
Admission: RE | Admit: 2024-06-25 | Discharge: 2024-06-25 | Disposition: A | Discharge status: Home / Self Care | Source: Ambulatory Visit | Attending: Nurse Practitioner | Admitting: Nurse Practitioner

## 2024-06-25 DIAGNOSIS — M5414 Radiculopathy, thoracic region: Secondary | ICD-10-CM | POA: Diagnosis present

## 2024-06-28 ENCOUNTER — Ambulatory Visit

## 2024-07-12 ENCOUNTER — Encounter: Payer: Self-pay | Admitting: Nurse Practitioner

## 2024-07-12 ENCOUNTER — Ambulatory Visit: Admitting: Nurse Practitioner

## 2024-07-12 VITALS — BP 124/76 | HR 74 | Temp 97.0°F | Ht 74.02 in | Wt 253.2 lb

## 2024-07-12 DIAGNOSIS — M25522 Pain in left elbow: Secondary | ICD-10-CM | POA: Diagnosis not present

## 2024-07-12 NOTE — Progress Notes (Signed)
 BP 124/76 (BP Location: Right Arm, Patient Position: Sitting, Cuff Size: Large)   Pulse 74   Temp (!) 97 F (36.1 C) (Oral)   Ht 6' 2.02 (1.88 m)   Wt 253 lb 3.2 oz (114.9 kg)   SpO2 96%   BMI 32.49 kg/m    Subjective:    Patient ID: Douglas [name], male    DOB: February 11, 1976, 48 y.o.   MRN: [id number]  HPI: Douglas [name] is a 48 y.o. male  Chief Complaint  Patient presents with  . Arm Pain    Patient stated he is losing mobility in his left arm. Patient stated it's from the elbow up, he feels like it's muscular pain.   Patient states two weekends ago he was helping his neighbor move palates of granite.  He was having to push and pull a lot during this.  States his pain is about a 4/10 but gets up to a 7-8/10 to raise his arm.  He is concerned about a torn muscle in his arm.  Has soreness on the inside part of his elbow.  Had a good amount of pain into his bicep.  He wasn't sure if he needed a referral to see Ortho.    Relevant past medical, surgical, family and social history reviewed and updated as indicated. Interim medical history since our last visit reviewed. Allergies and medications reviewed and updated.  Review of Systems  Musculoskeletal:        Left arm pain    Per HPI unless specifically indicated above     Objective:    BP 124/76 (BP Location: Right Arm, Patient Position: Sitting, Cuff Size: Large)   Pulse 74   Temp (!) 97 F (36.1 C) (Oral)   Ht 6' 2.02 (1.88 m)   Wt 253 lb 3.2 oz (114.9 kg)   SpO2 96%   BMI 32.49 kg/m   Wt Readings from Last 3 Encounters:  07/12/24 253 lb 3.2 oz (114.9 kg)  06/23/24 257 lb 12.8 oz (116.9 kg)  03/18/24 262 lb 6.4 oz (119 kg)    Physical Exam Vitals and nursing note reviewed.  Constitutional:      General: He is not in acute distress.    Appearance: Normal appearance. He is not ill-appearing, toxic-appearing or diaphoretic.  HENT:     Head: Normocephalic.     Right Ear: External ear normal.     Left  Ear: External ear normal.     Nose: Nose normal. No congestion or rhinorrhea.     Mouth/Throat:     Mouth: Mucous membranes are moist.  Eyes:     General:        Right eye: No discharge.        Left eye: No discharge.     Extraocular Movements: Extraocular movements intact.     Conjunctiva/sclera: Conjunctivae normal.     Pupils: Pupils are equal, round, and reactive to light.  Cardiovascular:     Rate and Rhythm: Normal rate and regular rhythm.     Heart sounds: No murmur heard. Pulmonary:     Effort: Pulmonary effort is normal. No respiratory distress.     Breath sounds: Normal breath sounds. No wheezing, rhonchi or rales.  Abdominal:     General: Abdomen is flat. Bowel sounds are normal.  Musculoskeletal:     Left elbow: Swelling present. No deformity, effusion or lacerations. Decreased range of motion. Tenderness present.     Cervical back: Normal range of motion and neck  supple.  Skin:    General: Skin is warm and dry.     Capillary Refill: Capillary refill takes less than 2 seconds.  Neurological:     General: No focal deficit present.     Mental Status: He is alert and oriented to person, place, and time.  Psychiatric:        Mood and Affect: Mood normal.        Behavior: Behavior normal.        Thought Content: Thought content normal.        Judgment: Judgment normal.     Results for orders placed or performed during the hospital encounter of 08/30/18  CBC with Differential   Collection Time: 08/30/18  6:50 PM  Result Value Ref Range   WBC 7.6 4.0 - 10.5 K/uL   RBC 4.97 4.22 - 5.81 MIL/uL   Hemoglobin 15.1 13.0 - 17.0 g/dL   HCT 54.9 60.9 - 47.9 %   MCV 90.5 80.0 - 100.0 fL   MCH 30.4 26.0 - 34.0 pg   MCHC 33.6 30.0 - 36.0 g/dL   RDW 88.2 88.4 - 84.4 %   Platelets 218 150 - 400 K/uL   nRBC 0.0 0.0 - 0.2 %   Neutrophils Relative % 62 %   Neutro Abs 4.7 1.7 - 7.7 K/uL   Lymphocytes Relative 26 %   Lymphs Abs 1.9 0.7 - 4.0 K/uL   Monocytes Relative 10 %    Monocytes Absolute 0.8 0.1 - 1.0 K/uL   Eosinophils Relative 1 %   Eosinophils Absolute 0.1 0.0 - 0.5 K/uL   Basophils Relative 1 %   Basophils Absolute 0.0 0.0 - 0.1 K/uL   Immature Granulocytes 0 %   Abs Immature Granulocytes 0.02 0.00 - 0.07 K/uL  Basic metabolic panel   Collection Time: 08/30/18  6:50 PM  Result Value Ref Range   Sodium 139 135 - 145 mmol/L   Potassium 4.1 3.5 - 5.1 mmol/L   Chloride 103 98 - 111 mmol/L   CO2 26 22 - 32 mmol/L   Glucose, Bld 122 (H) 70 - 99 mg/dL   BUN 15 6 - 20 mg/dL   Creatinine, Ser 8.87 0.61 - 1.24 mg/dL   Calcium 9.0 8.9 - 89.6 mg/dL   GFR calc non Af Amer >60 >60 mL/min   GFR calc Af Amer >60 >60 mL/min   Anion gap 10 5 - 15  I-Stat Troponin, ED (not at [facility])   Collection Time: 08/30/18  6:59 PM  Result Value Ref Range   Troponin i, poc 0.00 0.00 - 0.08 ng/mL   Comment 3              Assessment & Plan:   Problem List Items Addressed This Visit   None Visit Diagnoses       Left elbow pain    -  Primary   Referral placed to Orthopedics.  Recommend calling [facility] and getting an appointment as soon as he can.   Relevant Orders   Ambulatory referral to Orthopedic Surgery        Follow up plan: No follow-ups on file.

## 2024-07-26 ENCOUNTER — Ambulatory Visit
Admission: RE | Admit: 2024-07-26 | Discharge: 2024-07-26 | Disposition: A | Discharge status: Home / Self Care | Source: Ambulatory Visit | Attending: Emergency Medicine | Admitting: Emergency Medicine

## 2024-07-26 VITALS — BP 120/76 | HR 101 | Temp 98.1°F | Resp 20

## 2024-07-26 DIAGNOSIS — H6502 Acute serous otitis media, left ear: Secondary | ICD-10-CM

## 2024-07-26 LAB — POCT RAPID STREP A (OFFICE): Rapid Strep A Screen: NEGATIVE

## 2024-07-26 LAB — POC COVID19/FLU A&B COMBO
Covid Antigen, POC: NEGATIVE
Influenza A Antigen, POC: NEGATIVE
Influenza B Antigen, POC: NEGATIVE

## 2024-07-26 MED ORDER — AMOXICILLIN-POT CLAVULANATE 875-125 MG PO TABS: 1.0000 | ORAL_TABLET | Freq: Two times a day (BID) | ORAL | 0 refills | Status: DC

## 2024-07-26 NOTE — Discharge Instructions (Addendum)
 Your symptoms today of the whole are most likely being caused by a virus, on examination there is redness to your eardrum indicating and causing concern for bacteria and therefore you will be started on antibiotics because of this  COVID flu and strep testing negative  Take Augmentin  twice daily for 7 days    You can take Tylenol  and/or Ibuprofen as needed for fever reduction and pain relief.   For cough: honey 1/2 to 1 teaspoon (you can dilute the honey in water or another fluid).  You can also use guaifenesin and dextromethorphan for cough. You can use a humidifier for chest congestion and cough.  If you don't have a humidifier, you can sit in the bathroom with the hot shower running.      For sore throat: try warm salt water gargles, cepacol lozenges, throat spray, warm tea or water with lemon/honey, popsicles or ice, or OTC cold relief medicine for throat discomfort.   For congestion: take a daily anti-histamine like Zyrtec, Claritin, and a oral decongestant, such as pseudoephedrine.  You can also use Flonase 1-2 sprays in each nostril daily.   It is important to stay hydrated: drink plenty of fluids (water, gatorade/powerade/pedialyte, juices, or teas) to keep your throat moisturized and help further relieve irritation/discomfort.

## 2024-07-26 NOTE — ED Provider Notes (Signed)
 [facility]    CSN: [id number] Arrival date & time: 07/26/24  1332      History   Chief Complaint Chief Complaint  Patient presents with   Nasal Congestion    I started feeling bad yesterday with a sore throat, cough, congestion, possible fever, and chills - Entered by patient   Sore Throat   Chills    HPI RANDEL HARGENS is a 48 y.o. male.   Patient presents for evaluation of chills, fatigue and malaise, nasal congestion, sore throat and a productive cough with clear sputum sputum present for 1 day.  No known sick contacts.  Has attempted use of Mucinex.  Tolerable to food and liquids but appetite is decreased.  Denies shortness of breath or wheezing.  Denies respiratory history. Past Medical History:  Diagnosis Date   Allergy    Sulfa medication   Arthritis ?   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   Family history of bladder cancer    Family history of brain cancer    GERD (gastroesophageal reflux disease)    occasional   Headache(784.0)    cluster headache   Neuromuscular disorder (HCC)    tremors   Sleep apnea    cpap setting of 6    Patient Active Problem List   Diagnosis Date Noted   BMI 33.0-33.9,adult 03/18/2024   Family history of bladder cancer    Family history of brain cancer    Essential tremor 12/28/2014   Tension headache 05/15/2014   Post-op pain 04/29/2013   Bilateral inguinal hernia 04/04/2013    Past Surgical History:  Procedure Laterality Date   CARPAL TUNNEL RELEASE Bilateral  june left, right july 2013   CHOLECYSTECTOMY N/A 04/09/2017   Procedure: LAPAROSCOPIC CHOLECYSTECTOMY WITH INTRAOPERATIVE CHOLANGIOGRAM;  Surgeon: Vanderbilt Ned, MD;  Location: [facility] OR;  Service: General;  Laterality: N/A;   ELBOW SURGERY     pins in left elbow for broken elbow   FRACTURE SURGERY     Broke left elbow   HERNIA REPAIR  2011   INGUINAL HERNIA REPAIR N/A 03/29/2013   Procedure: LAPAROSCOPIC BILATERAL INGUINAL HERNIA REPAIR;   Surgeon: Ned A. Cornett, MD;  Location: [facility]S;  Service: General;  Laterality: N/A;   INSERTION OF MESH N/A 03/29/2013   Procedure: INSERTION OF MESH;  Surgeon: Ned LABOR. Cornett, MD;  Location: [facility] ORS;  Service: General;  Laterality: N/A;   INTERCOSTAL NERVE BLOCK  2015   SPINE SURGERY     Kyphoplasti in T7 Vertebrae       Home Medications    Prior to Admission medications  Medication Sig Start Date End Date Taking? Authorizing Provider  ascorbic acid (VITAMIN C) 1000 MG tablet Take 1,000 mg by mouth.    [provider]  cetirizine (ZYRTEC) 10 MG tablet Take 10 mg by mouth.    [provider]  chlorzoxazone (PARAFON) 500 MG tablet Take 500 mg by mouth. 04/18/15   [provider]  Cholecalciferol (VITAMIN D-1000 MAX ST) 25 MCG (1000 UT) tablet Take by mouth.    [provider]  cyanocobalamin (VITAMIN B12) 1000 MCG tablet Take 1,000 mcg by mouth.    [provider]  diclofenac (VOLTAREN) 75 MG EC tablet Take 75 mg by mouth every 8 (eight) hours as needed. 06/09/24   [provider]  HYDROcodone-acetaminophen  (NORCO/VICODIN) 5-325 MG tablet 1 tablet as needed Orally every 8 hrs prn; Duration: 10 days 06/23/24   [provider]  LYCOPENE PO  Take 1 tablet by mouth daily. Patient not taking: Reported on 07/12/2024    [provider]  primidone  (MYSOLINE ) 50 MG tablet Take 50 mg by mouth. 06/01/24 08/30/24  [provider]  propranolol  ER (INDERAL  LA) 160 MG SR capsule Take 160 mg by mouth. 02/16/24   [provider]  semaglutide -weight management (WEGOVY ) 1 MG/0.5ML SOAJ SQ injection Inject 1 mg into the skin once a week. 06/23/24   Melvin Pao, NP    Family History Family History  Problem Relation Age of Onset   Arthritis Mother    Diabetes Mother    Obesity Mother    Bladder Cancer Father 71   Arthritis Father    Cancer Father    Diabetes Father    Early death Father    Heart disease  Father    Kidney disease Father    Obesity Father    Varicose Veins Father    Tremor Brother    Brain cancer Maternal Grandfather        dx. mid-70s   Obesity Sister    Bladder Cancer Paternal Grandfather 76   Obesity Brother    Ulcerative colitis Daughter 8   Tremor Daughter     Social History Social History[1]   Allergies   Sulfa antibiotics, Rifampin, Sulfa drugs cross reactors, Sulfamethoxazole, and Sulfamethoxazole-trimethoprim   Review of Systems Review of Systems  Constitutional:  Positive for chills. Negative for activity change, appetite change, diaphoresis, fatigue, fever and unexpected weight change.  HENT:  Positive for congestion and sore throat. Negative for dental problem, drooling, ear discharge, ear pain, facial swelling, hearing loss, mouth sores, nosebleeds, postnasal drip, rhinorrhea, sinus pressure, sinus pain, sneezing, tinnitus, trouble swallowing and voice change.   Respiratory:  Positive for cough. Negative for apnea, choking, chest tightness, shortness of breath, wheezing and stridor.      Physical Exam Triage Vital Signs ED Triage Vitals  Encounter Vitals Group     BP 07/26/24 1406 120/76     Girls Systolic BP Percentile --      Girls Diastolic BP Percentile --      Boys Systolic BP Percentile --      Boys Diastolic BP Percentile --      Pulse Rate 07/26/24 1406 (!) 101     Resp 07/26/24 1406 20     Temp 07/26/24 1406 98.1 F (36.7 C)     Temp Source 07/26/24 1406 Oral     SpO2 07/26/24 1406 98 %     Weight --      Height --      Head Circumference --      Peak Flow --      Pain Score 07/26/24 1408 6     Pain Loc --      Pain Education --      Exclude from Growth Chart --    No data found.  Updated Vital Signs BP 120/76   Pulse (!) 101   Temp 98.1 F (36.7 C) (Oral)   Resp 20   SpO2 98%   Visual Acuity Right Eye Distance:   Left Eye Distance:   Bilateral Distance:    Right Eye Near:   Left Eye Near:    Bilateral Near:      Physical Exam Constitutional:      Appearance: Normal appearance. He is ill-appearing.  HENT:     Right Ear: Hearing, tympanic membrane, ear canal and external ear normal.     Left Ear: Hearing, ear canal and  external ear normal. Tympanic membrane is erythematous.     Nose: Congestion present.     Mouth/Throat:     Pharynx: No oropharyngeal exudate or posterior oropharyngeal erythema.  Cardiovascular:     Rate and Rhythm: Normal rate and regular rhythm.     Pulses: Normal pulses.     Heart sounds: Normal heart sounds.  Pulmonary:     Effort: Pulmonary effort is normal.     Breath sounds: Normal breath sounds.  Lymphadenopathy:     Cervical: Cervical adenopathy present.  Neurological:     Mental Status: He is alert and oriented to person, place, and time. Mental status is at baseline.      UC Treatments / Results  Labs (all labs ordered are listed, but only abnormal results are displayed) Labs Reviewed  POC COVID19/FLU A&B COMBO - Normal  POCT RAPID STREP A (OFFICE) - Normal    EKG   Radiology No results found.  Procedures Procedures (including critical care time)  Medications Ordered in UC Medications - No data to display  Initial Impression / Assessment and Plan / UC Course  I have reviewed the triage vital signs and the nursing notes.  Pertinent labs & imaging results that were available during my care of the patient were reviewed by me and considered in my medical decision making (see chart for details).  Nonrecurrent acute serous otitis media of the left ear  Vitals are stable, ill-appearing but patient in no signs of distress nor toxic appearing \, stable for outpatient management, strep, COVID and flu testing negative, discussed findings, erythema noted to the left tympanic membrane concerning for secondary bacterial infection, discussed this with patient, empirically placed on Augmentin  for treatment, declined prescription for prednisone and viscous  lidocaine  as sore throat is most worrisome symptom recommended over-the-counter medications and nonpharmacological supportive care and advised follow-up for any further concerns Final Clinical Impressions(s) / UC Diagnoses   Final diagnoses:  Viral illness   Discharge Instructions   None    ED Prescriptions   None    PDMP not reviewed this encounter.     [1]  Social History Tobacco Use   Smoking status: Former    Current packs/day: 0.00    Average packs/day: 1 pack/day for 8.0 years (8.0 ttl pk-yrs)    Types: Cigarettes    Start date: 02/07/1986    Quit date: 02/07/1994    Years since quitting: 30.4   Smokeless tobacco: Never  Vaping Use   Vaping status: Never Used  Substance Use Topics   Alcohol use: Not Currently    Comment: Quit dinking alcohol April 2024   Drug use: No     Teresa Shelba SAUNDERS, NP 07/26/24 1659

## 2024-07-26 NOTE — ED Triage Notes (Signed)
 Patient reports sore throat, chest congestion, clear nasal drainage, chills x 1 day. Patient has taken mucinex cold and flu this morning and around noon with no relief.  Rates pain 6/10.

## 2024-07-31 ENCOUNTER — Telehealth: Admitting: Physician Assistant

## 2024-07-31 DIAGNOSIS — R634 Abnormal weight loss: Secondary | ICD-10-CM

## 2024-07-31 NOTE — Progress Notes (Signed)
" °  Because of night sweats and unexplained weight loss, I feel your condition warrants further evaluation and I recommend that you be seen in a face-to-face visit.   NOTE: There will be NO CHARGE for this E-Visit   If you are having a true medical emergency, please call 911.     For an urgent face to face visit, [place] has multiple urgent care centers for your convenience.  Click the link below for the full list of locations and hours, walk-in wait times, appointment scheduling options and driving directions:  Urgent Care - [place], [place], [place], [place], [place], [place]  [place]     Your MyChart E-visit questionnaire answers were reviewed by a board certified advanced clinical practitioner to complete your personal care plan based on your specific symptoms.    Thank you for using e-Visits.    "

## 2024-08-01 ENCOUNTER — Ambulatory Visit

## 2024-08-01 ENCOUNTER — Ambulatory Visit
Admission: EM | Admit: 2024-08-01 | Discharge: 2024-08-01 | Disposition: A | Discharge status: Home / Self Care | Attending: Emergency Medicine | Admitting: Emergency Medicine

## 2024-08-01 ENCOUNTER — Ambulatory Visit: Payer: Self-pay

## 2024-08-01 DIAGNOSIS — J069 Acute upper respiratory infection, unspecified: Secondary | ICD-10-CM | POA: Diagnosis not present

## 2024-08-01 DIAGNOSIS — R051 Acute cough: Secondary | ICD-10-CM

## 2024-08-01 MED ORDER — CEFDINIR 300 MG PO CAPS: 300.0000 mg | ORAL_CAPSULE | Freq: Two times a day (BID) | ORAL | 0 refills | Status: AC

## 2024-08-01 MED ORDER — IPRATROPIUM BROMIDE 0.06 % NA SOLN: 2.0000 | Freq: Four times a day (QID) | NASAL | 12 refills | Status: AC

## 2024-08-01 MED ORDER — BENZONATATE 100 MG PO CAPS: 200.0000 mg | ORAL_CAPSULE | Freq: Three times a day (TID) | ORAL | 0 refills | Status: AC

## 2024-08-01 MED ORDER — PROMETHAZINE-DM 6.25-15 MG/5ML PO SYRP: 5.0000 mL | ORAL_SOLUTION | Freq: Four times a day (QID) | ORAL | 0 refills | Status: AC | PRN

## 2024-08-01 NOTE — ED Provider Notes (Signed)
 " [facility]    CSN: [id number] Arrival date & time: 08/01/24  1430      History   Chief Complaint Chief Complaint  Patient presents with   Cough    HPI Douglas [name] is a 48 y.o. male.   HPI  48 year old male with past medical history significant for sleep apnea, GERD, neuromuscular disorder, and arthritis presents for evaluation of bilateral ear pain, cough, and congestion has been going on for a week.  He denies any fever, drainage from his ears, change in hearing.  No wheezing but he does endorse a feeling of shortness of breath.  Past Medical History:  Diagnosis Date   Allergy    Sulfa medication   Arthritis ?   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   Family history of bladder cancer    Family history of brain cancer    GERD (gastroesophageal reflux disease)    occasional   Headache(784.0)    cluster headache   Neuromuscular disorder (HCC)    tremors   Sleep apnea    cpap setting of 6    Patient Active Problem List   Diagnosis Date Noted   BMI 33.0-33.9,adult 03/18/2024   Family history of bladder cancer    Family history of brain cancer    Essential tremor 12/28/2014   Tension headache 05/15/2014   Post-op pain 04/29/2013   Bilateral inguinal hernia 04/04/2013    Past Surgical History:  Procedure Laterality Date   CARPAL TUNNEL RELEASE Bilateral  june left, right july 2013   CHOLECYSTECTOMY N/A 04/09/2017   Procedure: LAPAROSCOPIC CHOLECYSTECTOMY WITH INTRAOPERATIVE CHOLANGIOGRAM;  Surgeon: Vanderbilt Ned, MD;  Location: [facility] OR;  Service: General;  Laterality: N/A;   ELBOW SURGERY     pins in left elbow for broken elbow   FRACTURE SURGERY     Broke left elbow   HERNIA REPAIR  2011   INGUINAL HERNIA REPAIR N/A 03/29/2013   Procedure: LAPAROSCOPIC BILATERAL INGUINAL HERNIA REPAIR;  Surgeon: Ned A. Cornett, MD;  Location: [facility];  Service: General;  Laterality: N/A;   INSERTION OF MESH N/A 03/29/2013   Procedure: INSERTION OF  MESH;  Surgeon: Ned LABOR. Cornett, MD;  Location: [facility];  Service: General;  Laterality: N/A;   INTERCOSTAL NERVE BLOCK  2015   SPINE SURGERY     Kyphoplasti in T7 Vertebrae       Home Medications    Prior to Admission medications  Medication Sig Start Date End Date Taking? Authorizing Provider  benzonatate  (TESSALON ) 100 MG capsule Take 2 capsules (200 mg total) by mouth every 8 (eight) hours. 08/01/24  Yes Bernardino Ditch, NP  cefdinir  (OMNICEF ) 300 MG capsule Take 1 capsule (300 mg total) by mouth 2 (two) times daily for 7 days. 08/01/24 08/08/24 Yes Bernardino Ditch, NP  ipratropium (ATROVENT ) 0.06 % nasal spray Place 2 sprays into both nostrils 4 (four) times daily. 08/01/24  Yes Bernardino Ditch, NP  promethazine -dextromethorphan (PROMETHAZINE -DM) 6.25-15 MG/5ML syrup Take 5 mLs by mouth 4 (four) times daily as needed. 08/01/24  Yes Bernardino Ditch, NP  ascorbic acid (VITAMIN C) 1000 MG tablet Take 1,000 mg by mouth.    [provider]  cetirizine (ZYRTEC) 10 MG tablet Take 10 mg by mouth.    [provider]  chlorzoxazone (PARAFON) 500 MG tablet Take 500 mg by mouth. 04/18/15   [provider]  Cholecalciferol (VITAMIN D-1000 MAX ST) 25 MCG (1000 UT) tablet Take by mouth.    [provider]  cyanocobalamin (VITAMIN B12) 1000 MCG tablet Take 1,000 mcg by mouth.    [provider]  diclofenac (VOLTAREN) 75 MG EC tablet Take 75 mg by mouth every 8 (eight) hours as needed. 06/09/24   [provider]  HYDROcodone-acetaminophen  (NORCO/VICODIN) 5-325 MG tablet 1 tablet as needed Orally every 8 hrs prn; Duration: 10 days 06/23/24   [provider]  LYCOPENE PO Take 1 tablet by mouth daily. Patient not taking: Reported on 07/12/2024    [provider]  primidone  (MYSOLINE ) 50 MG tablet Take 50 mg by mouth. 06/01/24 08/30/24  [provider]  propranolol  ER (INDERAL  LA) 160 MG SR capsule Take 160 mg by mouth. 02/16/24   [provider]  semaglutide -weight management (WEGOVY ) 1 MG/0.5ML SOAJ SQ injection Inject 1 mg into the skin once a week. 06/23/24   Melvin Pao, NP    Family History Family History  Problem Relation Age of Onset   Arthritis Mother    Diabetes Mother    Obesity Mother    Bladder Cancer Father 20   Arthritis Father    Cancer Father    Diabetes Father    Early death Father    Heart disease Father    Kidney disease Father    Obesity Father    Varicose Veins Father    Tremor Brother    Brain cancer Maternal Grandfather        dx. mid-70s   Obesity Sister    Bladder Cancer Paternal Grandfather 21   Obesity Brother    Ulcerative colitis Daughter 8   Tremor Daughter     Social History Social History[1]   Allergies   Sulfa antibiotics, Rifampin, Sulfa drugs cross reactors, Sulfamethoxazole, and Sulfamethoxazole-trimethoprim   Review of Systems Review of Systems  Constitutional:  Negative for fever.  HENT:  Positive for congestion, ear pain and rhinorrhea. Negative for ear discharge, hearing loss and sore throat.   Respiratory:  Positive for cough and shortness of breath. Negative for wheezing.      Physical Exam Triage Vital Signs ED Triage Vitals  Encounter Vitals Group     BP      Girls Systolic BP Percentile      Girls Diastolic BP Percentile      Boys Systolic BP Percentile      Boys Diastolic BP Percentile      Pulse      Resp      Temp      Temp src      SpO2      Weight      Height      Head Circumference      Peak Flow      Pain Score      Pain Loc      Pain Education      Exclude from Growth Chart    No data found.  Updated Vital Signs BP 128/74 (BP Location: Left Arm)   Pulse 69   Temp 98.3 F (36.8 C) (Oral)   Resp 16   SpO2 94%   Visual Acuity Right Eye Distance:   Left Eye Distance:   Bilateral Distance:    Right Eye Near:   Left Eye Near:    Bilateral Near:     Physical Exam Vitals and nursing note reviewed.   Constitutional:      Appearance: Normal appearance. He is not ill-appearing.  HENT:     Head: Normocephalic and atraumatic.     Right  Ear: Ear canal and external ear normal. There is no impacted cerumen.     Left Ear: Ear canal and external ear normal. There is no impacted cerumen.     Ears:     Comments: Mild erythema to the rim of both tympanic membranes but no injection.  No retraction or bulging noted.  No effusion.    Nose: Congestion and rhinorrhea present.     Comments: These mucosa is edematous and pale with scant clear discharge in both nares.    Mouth/Throat:     Mouth: Mucous membranes are moist.     Pharynx: Oropharynx is clear. No oropharyngeal exudate or posterior oropharyngeal erythema.  Cardiovascular:     Rate and Rhythm: Normal rate and regular rhythm.     Pulses: Normal pulses.     Heart sounds: Normal heart sounds. No murmur heard.    No friction rub. No gallop.  Pulmonary:     Effort: Pulmonary effort is normal.     Breath sounds: Normal breath sounds. No wheezing, rhonchi or rales.  Musculoskeletal:     Cervical back: Normal range of motion and neck supple. No tenderness.  Lymphadenopathy:     Cervical: No cervical adenopathy.  Skin:    General: Skin is warm and dry.     Capillary Refill: Capillary refill takes less than 2 seconds.     Findings: No erythema or rash.  Neurological:     General: No focal deficit present.     Mental Status: He is alert and oriented to person, place, and time.      UC Treatments / Results  Labs (all labs ordered are listed, but only abnormal results are displayed) Labs Reviewed - No data to display  EKG   Radiology DG Chest 2 View Result Date: 08/01/2024 CLINICAL DATA:  Cough congestion EXAM: CHEST - 2 VIEW COMPARISON:  07/19/2010, chest CT 06/21/2024 FINDINGS: The heart size and mediastinal contours are within normal limits. Both lungs are clear. Post augmentation changes of midthoracic vertebra IMPRESSION: No  active cardiopulmonary disease. Electronically Signed   By: Luke Bun M.D.   On: 08/01/2024 16:11    Procedures Procedures (including critical care time)  Medications Ordered in UC Medications - No data to display  Initial Impression / Assessment and Plan / UC Course  I have reviewed the triage vital signs and the nursing notes.  Pertinent labs & imaging results that were available during my care of the patient were reviewed by me and considered in my medical decision making (see chart for details).   Patient is a nontoxic-appearing 48 year old male presenting for evaluation of 1 week where the respiratory symptoms as outlined in HPI above.  The patient was seen on 07/26/2024 at our urgent care in [place] and diagnosed with nonrecurrent acute serous otitis media of the left ear.  Had negative strep, COVID, and flu testing at that time.  He was discharged home on a 7-day course of Augmentin  which he completed.  The patient had an ED visit yesterday and was advised to be seen in person due to the fact that he been experiencing night sweats and unexplained weight loss.  He called the nurse triage line for his PCP today who advised him to come to urgent care for imaging.  The patient does have a cough in the exam room that is harsh but he is not expectorating any mucus.  His lungs are clear to auscultation in all fields.  He reports that he feels short of  breath as he cannot take in a full breath but he is able to speak in full sentences without dyspnea or tachypnea.  I will obtain a chest radiograph to evaluate for any acute cardiopulmonary process.  Chest x-ray independently reviewed and evaluated by me.  Impression: Questionable streaky infiltrate in the left lower lobe..  Radiology overread pending. Radiology impression states no active cardiopulmonary disease.  I will discharge the patient home with diagnosis of URI with cough and congestion.  I will do a second of antibiotics and put him  on a 7-day course of cefdinir  along with Atrovent  nasal spray, Tessalon  Perles, Promethazine  DM cough syrup.   Final Clinical Impressions(s) / UC Diagnoses   Final diagnoses:  Acute cough  URI with cough and congestion     Discharge Instructions      Your chest x-ray did not show any evidence of pneumonia.  Given that you have have an upper respiratory tract infection, your symptoms seem to be worsening and improving, I do feel a second round of antibiotics is warranted.  Take the cefdinir  twice daily with food for 7 days for treatment of your URI.  Continue to use over-the-counter Tylenol  and/or ibuprofen as needed for any fever or pain.  Use the Atrovent  nasal spray, 2 squirts in each nostril every 6 hours, as needed for runny nose and postnasal drip.  Use the Tessalon  Perles every 8 hours during the day.  Take them with a small sip of water.  They may give you some numbness to the base of your tongue or a metallic taste in your mouth, this is normal.  Use the Promethazine  DM cough syrup at bedtime for cough and congestion.  It will make you drowsy so do not take it during the day.  Return for reevaluation or see your primary care provider for any new or worsening symptoms.      ED Prescriptions     Medication Sig Dispense Auth. Provider   benzonatate  (TESSALON ) 100 MG capsule Take 2 capsules (200 mg total) by mouth every 8 (eight) hours. 21 capsule Bernardino Ditch, NP   cefdinir  (OMNICEF ) 300 MG capsule Take 1 capsule (300 mg total) by mouth 2 (two) times daily for 7 days. 14 capsule Bernardino Ditch, NP   ipratropium (ATROVENT ) 0.06 % nasal spray Place 2 sprays into both nostrils 4 (four) times daily. 15 mL Bernardino Ditch, NP   promethazine -dextromethorphan (PROMETHAZINE -DM) 6.25-15 MG/5ML syrup Take 5 mLs by mouth 4 (four) times daily as needed. 118 mL Bernardino Ditch, NP      PDMP not reviewed this encounter.     [1]  Social History Tobacco Use   Smoking status: Former     Current packs/day: 0.00    Average packs/day: 1 pack/day for 8.0 years (8.0 ttl pk-yrs)    Types: Cigarettes    Start date: 02/07/1986    Quit date: 02/07/1994    Years since quitting: 30.5   Smokeless tobacco: Never  Vaping Use   Vaping status: Never Used  Substance Use Topics   Alcohol use: Not Currently    Comment: Quit dinking alcohol April 2024   Drug use: No     Bernardino Ditch, NP 08/01/24 1617  "

## 2024-08-01 NOTE — Discharge Instructions (Addendum)
 Your chest x-ray did not show any evidence of pneumonia.  Given that you have have an upper respiratory tract infection, your symptoms seem to be worsening and improving, I do feel a second round of antibiotics is warranted.  Take the cefdinir  twice daily with food for 7 days for treatment of your URI.  Continue to use over-the-counter Tylenol  and/or ibuprofen as needed for any fever or pain.  Use the Atrovent  nasal spray, 2 squirts in each nostril every 6 hours, as needed for runny nose and postnasal drip.  Use the Tessalon  Perles every 8 hours during the day.  Take them with a small sip of water.  They may give you some numbness to the base of your tongue or a metallic taste in your mouth, this is normal.  Use the Promethazine  DM cough syrup at bedtime for cough and congestion.  It will make you drowsy so do not take it during the day.  Return for reevaluation or see your primary care provider for any new or worsening symptoms.

## 2024-08-01 NOTE — ED Triage Notes (Signed)
 Patient presents to [facility] for bilateral ear pain, cough, congestion x 1 week. Last known fever last week. He was seen at [facility] last week. He was treated for left ear infection and completed his amoxicillin .   Denies ear drainage or fever.

## 2024-08-01 NOTE — Telephone Encounter (Signed)
 FYI Only or Action Required?: FYI only for provider: UC advised.  Patient was last seen in primary care on 07/12/2024 by Melvin Pao, NP.  Called Nurse Triage reporting Cough.  Symptoms began a week ago.  Interventions attempted: Prescription medications: amoxicillin ; tylenol  and Rest, hydration, or home remedies.  Symptoms are: gradually worsening.  Triage Disposition: See HCP Within 4 Hours (Or PCP Triage)  Patient/caregiver understands and will follow disposition?: Yes    Copied from CRM #[id number]. Topic: Clinical - Medical Advice >> Aug 01, 2024  8:31 AM Victoria B wrote: Patient has bad cough, chest congestion, getting worse and says was told has ear infection in both ears >> Aug 01, 2024  1:16 PM Sophia H wrote: Patient is following up - states was told someone would contact him within an hour and hasn't heard back.  Patient is stating he is having bad sore throat pain, congestion, coughing up clear mucus and bad ear pain (both sides)     Reason for Disposition  Wheezing is present  Answer Assessment - Initial Assessment Questions 1. ONSET: When did the cough begin?      Pt and wife on speaker phone, contacted clinic to report worsening symptoms since 12/16. Pt was seen at [facility] for cough, chest congestion and ear pain in R ear. Pt was prescribed amoxicillin  q12h x7days, reports no relief and worsening of symptoms. 12/16 tested neg for flu/covid/strep. Pt states today his cough is much worse, causing audible coughing spelling during conversation with NT and now ear pain bilaterally. Pt denies fever since last week. She did report she can audibly hear crackles and rales when pt is coughing or gasping after spell. Symptoms present x 1 week. Discussed appt vs. UC d/t pt potentially needing imaging. Pt would prefer to have chest xray to r/o pneumonia pt and wife verbalized that is what they are most concerned about. Pt to go to UC. Discussed if pt became febrile, had SOB that ED  would be better for higher level of medical attention.  Protocols used: Cough - Acute Non-Productive-A-AH

## 2024-08-13 ENCOUNTER — Other Ambulatory Visit: Payer: Self-pay | Admitting: Nurse Practitioner

## 2024-08-15 ENCOUNTER — Other Ambulatory Visit: Payer: Self-pay | Admitting: Nurse Practitioner

## 2024-08-15 NOTE — Telephone Encounter (Signed)
 Requested Prescriptions  Pending Prescriptions Disp Refills   WEGOVY  1 MG/0.5ML SOAJ SQ injection [Pharmacy Med Name: WEGOVY  1 MG/0.5 ML PEN] 2 mL 2    Sig: INJECT 1MG  INTO THE SKIN ONCE A WEEK     Endocrinology:  Diabetes - GLP-1 Receptor Agonists - semaglutide  Failed - 08/15/2024  4:06 PM      Failed - HBA1C in normal range and within 180 days    No results found for: HGBA1C, LABA1C       Failed - Cr in normal range and within 360 days    Creatinine, Ser  Date Value Ref Range Status  08/30/2018 1.12 0.61 - 1.24 mg/dL Final         Passed - Valid encounter within last 6 months    Recent Outpatient Visits           1 month ago Left elbow pain   [place] [facility] Melvin Pao, NP   1 month ago BMI 33.0-33.9,adult   [place] [facility] Melvin Pao, NP   5 months ago Essential tremor   [facility] Melvin Pao, NP

## 2024-08-17 ENCOUNTER — Telehealth: Payer: Self-pay

## 2024-08-17 ENCOUNTER — Other Ambulatory Visit (HOSPITAL_COMMUNITY): Payer: Self-pay

## 2024-08-17 NOTE — Telephone Encounter (Signed)
 Can we do a PA for this Wegovy  1 mg please?

## 2024-08-17 NOTE — Telephone Encounter (Signed)
 Pharmacy Patient Advocate Encounter   Received notification from RX Request Messages that prior authorization for Wegovy  1MG /0.5ML auto-injectors is required/requested.   Insurance verification completed.   The patient is insured through [employer].   Per test claim: PA required; PA submitted to above mentioned insurance via Latent Key/confirmation #/EOC [facility] Status is pending

## 2024-08-18 NOTE — Telephone Encounter (Signed)
 Pharmacy Patient Advocate Encounter  Received notification from [employer] that Prior Authorization for Wegovy  1MG /0.5ML auto-injectors  has been DENIED.  Full denial letter will be uploaded to the media tab. See denial reason below.   PA #/Case ID/Reference #: [id number]

## 2024-09-23 ENCOUNTER — Encounter: Admitting: Nurse Practitioner
